# Patient Record
Sex: Female | Born: 1960 | Race: White | Hispanic: No | Marital: Married | Smoking: Former smoker
Health system: Southern US, Community
[De-identification: ages and names within clinical notes are randomized; demographics above are authoritative.]

## PROBLEM LIST (undated history)

## (undated) DIAGNOSIS — F419 Anxiety disorder, unspecified: Secondary | ICD-10-CM

## (undated) DIAGNOSIS — E7404 McArdle disease: Secondary | ICD-10-CM

## (undated) DIAGNOSIS — T7840XA Allergy, unspecified, initial encounter: Secondary | ICD-10-CM

## (undated) HISTORY — PX: COLONOSCOPY: SHX174

## (undated) HISTORY — DX: McArdle disease: E74.04

## (undated) HISTORY — PX: INNER EAR SURGERY: SHX679

## (undated) HISTORY — DX: Allergy, unspecified, initial encounter: T78.40XA

## (undated) HISTORY — PX: UPPER GASTROINTESTINAL ENDOSCOPY: SHX188

## (undated) HISTORY — DX: Anxiety disorder, unspecified: F41.9

---

## 1983-04-27 DIAGNOSIS — E7404 McArdle disease: Secondary | ICD-10-CM

## 1983-04-27 HISTORY — DX: McArdle disease: E74.04

## 1997-09-26 ENCOUNTER — Other Ambulatory Visit: Admission: RE | Admit: 1997-09-26 | Discharge: 1997-09-26 | Payer: Self-pay | Admitting: Obstetrics and Gynecology

## 1998-12-24 ENCOUNTER — Other Ambulatory Visit: Admission: RE | Admit: 1998-12-24 | Discharge: 1998-12-24 | Payer: Self-pay | Admitting: Obstetrics and Gynecology

## 1999-02-11 ENCOUNTER — Encounter: Admission: RE | Admit: 1999-02-11 | Discharge: 1999-02-11 | Payer: Self-pay | Admitting: Obstetrics and Gynecology

## 1999-02-11 ENCOUNTER — Encounter: Payer: Self-pay | Admitting: Obstetrics and Gynecology

## 2000-02-11 ENCOUNTER — Encounter: Payer: Self-pay | Admitting: Gynecology

## 2000-02-11 ENCOUNTER — Encounter: Admission: RE | Admit: 2000-02-11 | Discharge: 2000-02-11 | Payer: Self-pay | Admitting: Gynecology

## 2002-01-15 ENCOUNTER — Other Ambulatory Visit: Admission: RE | Admit: 2002-01-15 | Discharge: 2002-01-15 | Payer: Self-pay | Admitting: Gynecology

## 2002-03-19 ENCOUNTER — Encounter: Admission: RE | Admit: 2002-03-19 | Discharge: 2002-03-19 | Payer: Self-pay | Admitting: Gynecology

## 2002-03-19 ENCOUNTER — Encounter: Payer: Self-pay | Admitting: Gynecology

## 2003-02-05 ENCOUNTER — Other Ambulatory Visit: Admission: RE | Admit: 2003-02-05 | Discharge: 2003-02-05 | Payer: Self-pay | Admitting: Gynecology

## 2003-08-05 ENCOUNTER — Encounter: Admission: RE | Admit: 2003-08-05 | Discharge: 2003-08-05 | Payer: Self-pay | Admitting: Gynecology

## 2004-02-17 ENCOUNTER — Other Ambulatory Visit: Admission: RE | Admit: 2004-02-17 | Discharge: 2004-02-17 | Payer: Self-pay | Admitting: Gynecology

## 2005-03-08 ENCOUNTER — Encounter: Admission: RE | Admit: 2005-03-08 | Discharge: 2005-03-08 | Payer: Self-pay | Admitting: Gynecology

## 2005-03-08 ENCOUNTER — Other Ambulatory Visit: Admission: RE | Admit: 2005-03-08 | Discharge: 2005-03-08 | Payer: Self-pay | Admitting: Gynecology

## 2006-03-14 ENCOUNTER — Other Ambulatory Visit: Admission: RE | Admit: 2006-03-14 | Discharge: 2006-03-14 | Payer: Self-pay | Admitting: Gynecology

## 2006-04-04 ENCOUNTER — Encounter: Admission: RE | Admit: 2006-04-04 | Discharge: 2006-04-04 | Payer: Self-pay | Admitting: Gynecology

## 2007-04-17 ENCOUNTER — Other Ambulatory Visit: Admission: RE | Admit: 2007-04-17 | Discharge: 2007-04-17 | Payer: Self-pay | Admitting: Gynecology

## 2007-04-21 ENCOUNTER — Encounter: Admission: RE | Admit: 2007-04-21 | Discharge: 2007-04-21 | Payer: Self-pay | Admitting: Gynecology

## 2007-05-01 ENCOUNTER — Ambulatory Visit (HOSPITAL_COMMUNITY): Admission: RE | Admit: 2007-05-01 | Discharge: 2007-05-01 | Payer: Self-pay | Admitting: Otolaryngology

## 2008-04-29 ENCOUNTER — Encounter: Admission: RE | Admit: 2008-04-29 | Discharge: 2008-04-29 | Payer: Self-pay | Admitting: Gynecology

## 2009-05-12 ENCOUNTER — Encounter: Admission: RE | Admit: 2009-05-12 | Discharge: 2009-05-12 | Payer: Self-pay | Admitting: Gynecology

## 2010-07-06 ENCOUNTER — Other Ambulatory Visit: Payer: Self-pay | Admitting: Gynecology

## 2010-09-08 NOTE — Op Note (Signed)
[name], [name]             ACCOUNT NO.:  [id number]   MEDICAL RECORD NO.:  [id number]          PATIENT TYPE:  AMB   LOCATION:  SDS                          FACILITY:  [facility]   PHYSICIAN:  Karol T. Lazarus Salines, M.D. DATE OF BIRTH:  26-Jan-1961   DATE OF PROCEDURE:  05/01/2007  DATE OF DISCHARGE:                               OPERATIVE REPORT   PREOPERATIVE DIAGNOSIS:  Left pantympanic perforation, central.   POSTOPERATIVE DIAGNOSIS:  Left pantympanic perforation, central.   PROCEDURE PERFORMED:  A revision left lateral technique tympanoplasty.   SURGEON:  Gloris Manchester. Lazarus Salines, M.D.   ANESTHESIA:  General orotracheal.   BLOOD LOSS:  Minimal.   COMPLICATIONS:  None.   FINDINGS:  A roughly 80% central perforation with an absent long handle  of the malleus consistent with prior surgery.  Mobile ossicular chain.  Healthy middle ear mucosa.  A small mastoid cavity.   PROCEDURE:  With the patient in a comfortable supine position, general  orotracheal anesthesia was induced without difficulty.  At an  appropriate level, the table was turned 90 degrees and the patient  placed in slight reverse Trendelenburg.  The head was rotated to the  right for access to the left ear.  Identifying marks were noted.  The  hair was taped out of the position.  A sterile preparation and draping  of the left ear was accomplished.   1 mL of 1% Xylocaine with 1:100,000 epinephrine was infiltrated into the  vascular strip from posteriorly.  Working down the ear canal, speculum  and cerumen spoon were used to clear where a small amount of wax.  The  external meatus was quite narrow.  Four quadrant injections were  performed with 1% Xylocaine with 1:100,000 epinephrine in the standard  fashion, 2 mL total.  The residual Xylocaine was suctioned free from the  middle ear.   Post auricular incision was reopened and carried down to the bone over  the mastoid and up down to the temporalis muscle.  There was  missing  fascia consistent with two prior tympanoplasties.  Working further  forward, a roughly 1.5 x 1.5 cm piece of fascia was harvested cleaned,  pressed and placed to dry.  Either the superficial temporal artery and  vein or branches thereof were encountered and were finally controlled  with cautery.   The periosteum was dissected at the linea temporalis and then down  across the mastoid cortex and the soft tissue was rolled forward.  A  cavity was encountered and determined to be the mastoid cavity.  Working  from superiorly and inferiorly, the remains of the external canal wall  were identified and dissection was begun down the external canal wall  separating the covering of the mastoid cavity and leaving it in place.   Returning to the ear canal, 6 and 12 o'clock positions were made  beginning at the annulus and carried outward.  These were connected at  the annulus level posteriorly.   Returning to the post auricular area, the ear was laid forward with a  self-retaining retractor.  Dissecting down the ear canal with the  Shambaugh elevator and then with a Rosen round knife, the previously  generated incisions were encountered and the vascular strip flap was  elevated upward and the ear was held forward with a Emergency planning/management officer.  A second self-retaining retractor was placed.  Connecting the 6 and 12  o'clock incisions laterally just at the bony cartilaginous junction, the  flap was window shaded down to the anterior annulus intact.  Upon laying  the flap down against the drum, a 3-mm cutting and 2-mm diamond drill  were used to remove the anterior canal wall bulge to allow better  visualization and access down into the anterior sulcus.  The area was  thoroughly irrigated and cleansed.   Using a small Rosen round knife and the tab knife, the skin was laid  down the level of the annulus and then working circumferentially along  the annulus, was dissected off the annulus and  brought inferiorly,  superiorly to the short process of the malleus and then posteriorly.  The skin was elevated from the fascial layer of the drum which was  minimal in remnant.  The skin was preserved for later use.  A small chip  probably of malleus was dissected and removed.  The stump of the long  process of the malleus was identified and the posterior annulus was  elevated and with mobilization of the malleus, the long process of the  incus and stapes superstructure were observed to move.  No further  attention to the ossicular chain was necessary.  The annulus was laid  back down posteriorly.   At this point, the temporalis fascia was cut into a tongue shape and  then placed down.  It tucked up against the anterior canal wall laying  on top of the annulus with an excellent fit and did not overlap.  It was  laid up the posterior canal wall.  The ear canal skin flap which had  been carefully oriented to be squamous side up was laid against the drum  and anterior canal wall and tucked down into the anterior sulcus.  Thin  strips of dried Gelfoam were tucked into the anterior sulcus to hold  this down to prevent blunting.  After applying several small strips,  larger pieces of Ciprodex moistened Gelfoam were placed to support the  graft.   At this point, self-retaining retractors and Perkins retractors were  removed.  The ear was laid back down.  The skin flap was laid forward  posteriorly and then working down the canal with the speculum, the flap  was laid on top of the temporalis fascia graft and the canal was further  supported with loosely placed Ciprodex moistened Gelfoam.  Hemostasis  was observed.  Postauricular wound was closed with interrupted 4-0  chromic sutures.  Benzoin and Steri-Strips were used to finish the post  auricular closure.  A cotton ball was wedged in the external meatus  deeply.  Another cotton ball was laid into the conchal bowl.  This  completed the  procedure.  A standard Glasscock dressing was applied  without difficulty.  At this point the procedure was completed.  The  patient was returned to Anesthesia, awakened, extubated, and transferred  to recovery in stable condition.   COMMENT:  A 50 year old white female status post two prior attempts at  tympanoplasty, one in childhood and this procedure apparently included a  mastoidectomy and then a second procedure by Dr. Nedra Hai using medial  technique some years back.  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  Anticipate routine postoperative recovery with attention to  ice, elevation, antibiosis, analgesia, avoidance of water exposure and  nose blowing.  I will see her back in one week to remove the cotton ball  and begin to dissolve the Gelfoam.  Given low anticipated risk of  postanesthetic or postsurgical complications, I feel outpatient venue is  appropriate.      Gloris Manchester. Lazarus Salines, M.D.  Electronically Signed     KTW/MEDQ  D:  05/01/2007  T:  05/01/2007  Job:  [id number]

## 2011-01-14 LAB — COMPREHENSIVE METABOLIC PANEL WITH GFR
ALT: 52 — ABNORMAL HIGH
AST: 61 — ABNORMAL HIGH
Albumin: 3.5
Alkaline Phosphatase: 59
BUN: 6
CO2: 29
Calcium: 8.6
Chloride: 106
Creatinine, Ser: 0.76
GFR calc non Af Amer: >60
Glucose, Bld: 90
Potassium: 3.5
Sodium: 139
Total Bilirubin: 0.6
Total Protein: 5.9 — ABNORMAL LOW

## 2011-01-14 LAB — CBC
HCT: 40.8
Hemoglobin: 14
MCHC: 34.2
MCV: 91.8
Platelets: 229
RBC: 4.45
RDW: 12.6
WBC: 5.5

## 2011-01-14 LAB — URINALYSIS, MICROSCOPIC ONLY
Glucose, UA: NEGATIVE
Hgb urine dipstick: NEGATIVE
Ketones, ur: NEGATIVE
Protein, ur: NEGATIVE

## 2011-05-11 ENCOUNTER — Other Ambulatory Visit: Payer: Self-pay | Admitting: Psychiatry

## 2011-06-01 ENCOUNTER — Other Ambulatory Visit: Payer: Self-pay | Admitting: Gynecology

## 2011-06-01 DIAGNOSIS — Z1231 Encounter for screening mammogram for malignant neoplasm of breast: Secondary | ICD-10-CM

## 2011-06-21 ENCOUNTER — Ambulatory Visit
Admission: RE | Admit: 2011-06-21 | Discharge: 2011-06-21 | Disposition: A | Discharge status: Home / Self Care | Payer: BC Managed Care – PPO | Source: Ambulatory Visit | Attending: Gynecology | Admitting: Gynecology

## 2011-06-21 DIAGNOSIS — Z1231 Encounter for screening mammogram for malignant neoplasm of breast: Secondary | ICD-10-CM

## 2011-07-12 ENCOUNTER — Other Ambulatory Visit: Payer: Self-pay | Admitting: Gynecology

## 2012-06-20 ENCOUNTER — Other Ambulatory Visit: Payer: Self-pay | Admitting: Gynecology

## 2012-06-20 DIAGNOSIS — Z1231 Encounter for screening mammogram for malignant neoplasm of breast: Secondary | ICD-10-CM

## 2012-06-26 ENCOUNTER — Ambulatory Visit: Payer: BC Managed Care – PPO

## 2012-07-03 ENCOUNTER — Ambulatory Visit
Admission: RE | Admit: 2012-07-03 | Discharge: 2012-07-03 | Disposition: A | Discharge status: Home / Self Care | Payer: BC Managed Care – PPO | Source: Ambulatory Visit | Attending: Gynecology | Admitting: Gynecology

## 2012-07-03 DIAGNOSIS — Z1231 Encounter for screening mammogram for malignant neoplasm of breast: Secondary | ICD-10-CM

## 2012-11-09 ENCOUNTER — Ambulatory Visit (INDEPENDENT_AMBULATORY_CARE_PROVIDER_SITE_OTHER): Payer: BC Managed Care – PPO | Admitting: General Practice

## 2012-11-09 ENCOUNTER — Encounter: Payer: Self-pay | Admitting: General Practice

## 2012-11-09 VITALS — BP 107/71 | HR 74 | Temp 97.0°F | Ht 63.0 in | Wt 139.0 lb

## 2012-11-09 DIAGNOSIS — R300 Dysuria: Secondary | ICD-10-CM

## 2012-11-09 DIAGNOSIS — Z0000 Encounter for general adult medical examination without abnormal findings: Secondary | ICD-10-CM

## 2012-11-09 DIAGNOSIS — R5381 Other malaise: Secondary | ICD-10-CM

## 2012-11-09 DIAGNOSIS — Z833 Family history of diabetes mellitus: Secondary | ICD-10-CM

## 2012-11-09 DIAGNOSIS — R5383 Other fatigue: Secondary | ICD-10-CM

## 2012-11-09 LAB — COMPLETE METABOLIC PANEL WITHOUT GFR
ALT: 40 U/L — ABNORMAL HIGH (ref 0–35)
AST: 47 U/L — ABNORMAL HIGH (ref 0–37)
Albumin: 4.2 g/dL (ref 3.5–5.2)
Alkaline Phosphatase: 70 U/L (ref 39–117)
BUN: 18 mg/dL (ref 6–23)
CO2: 30 meq/L (ref 19–32)
Calcium: 9.5 mg/dL (ref 8.4–10.5)
Chloride: 104 meq/L (ref 96–112)
Creat: 0.77 mg/dL (ref 0.50–1.10)
GFR, Est African American: >89 mL/min
GFR, Est Non African American: 88 mL/min
Glucose, Bld: 85 mg/dL (ref 70–99)
Potassium: 4.4 meq/L (ref 3.5–5.3)
Sodium: 142 meq/L (ref 135–145)
Total Bilirubin: 0.6 mg/dL (ref 0.3–1.2)
Total Protein: 6.5 g/dL (ref 6.0–8.3)

## 2012-11-09 LAB — POCT CBC
Granulocyte percent: 65.7 %G (ref 37–80)
HCT, POC: 40.8 % (ref 37.7–47.9)
Hemoglobin: 14.8 g/dL (ref 12.2–16.2)
MCH, POC: 33.2 pg — AB (ref 27–31.2)
MCHC: 36.3 g/dL — AB (ref 31.8–35.4)
MCV: 91.3 fL (ref 80–97)
MPV: 7.4 fL (ref 0–99.8)
POC Granulocyte: 3.2 (ref 2–6.9)
Platelet Count, POC: 195 10*3/uL (ref 142–424)
RBC: 4.4 M/uL (ref 4.04–5.48)
RDW, POC: 12.2 %

## 2012-11-09 LAB — POCT UA - MICROSCOPIC ONLY
Bacteria, U Microscopic: NEGATIVE
Casts, Ur, LPF, POC: NEGATIVE
Yeast, UA: NEGATIVE

## 2012-11-09 LAB — POCT URINALYSIS DIPSTICK
Glucose, UA: NEGATIVE
Ketones, UA: NEGATIVE
Protein, UA: NEGATIVE
Spec Grav, UA: 1.02
pH, UA: 6

## 2012-11-09 LAB — THYROID PANEL WITH TSH
Free Thyroxine Index: 2.5 (ref 1.0–3.9)
T3 Uptake: 31.6 % (ref 22.5–37.0)
T4, Total: 7.8 ug/dL (ref 5.0–12.5)
TSH: 1.501 u[IU]/mL (ref 0.350–4.500)

## 2012-11-09 LAB — VITAMIN B12: Vitamin B-12: 558 pg/mL (ref 211–911)

## 2012-11-09 NOTE — Progress Notes (Signed)
  Subjective:    Patient ID: [name], female    DOB: 04-22-1961, 52 y.o.   MRN: [id number]  HPI Patient presents today for annual physical, without pap. She reports having a gyn. She reports being more tired than normal. She also reports dark colored urine and burning with urination times one week. She denies vaginal discharge. She reports taking wellbutrin, lexapro, and xanax, which are prescribed by another provider. She reports medications are effective and denies any other complaints.    Review of Systems  Constitutional: Negative for fever and chills.  HENT: Negative for neck pain and neck stiffness.   Respiratory: Negative for chest tightness and shortness of breath.   Cardiovascular: Negative for chest pain and palpitations.  Gastrointestinal: Negative for nausea, vomiting, abdominal pain and blood in stool.  Genitourinary: Positive for dysuria and frequency. Negative for urgency, vaginal bleeding, vaginal discharge and difficulty urinating.  Skin: Negative for rash.  Neurological: Negative for dizziness, weakness and headaches.       Objective:   Physical Exam  Constitutional: She is oriented to person, place, and time. She appears well-developed and well-nourished.  HENT:  Head: Normocephalic and atraumatic.  Right Ear: External ear normal.  Left Ear: External ear normal.  Mouth/Throat: Oropharynx is clear and moist.  Cardiovascular: Normal rate, regular rhythm and normal heart sounds.   Pulmonary/Chest: Effort normal and breath sounds normal.  Abdominal: Soft. Bowel sounds are normal. She exhibits no distension. There is no tenderness.  Neurological: She is alert and oriented to person, place, and time.  Skin: Skin is warm and dry.  Psychiatric: She has a normal mood and affect.          Assessment & Plan:  1. Dysuria - POCT urinalysis dipstick - POCT UA - Microscopic Only  2. Family history of diabetes mellitus - POCT glycosylated hemoglobin (Hb  A1C)  3. Annual physical exam - POCT CBC - COMPLETE METABOLIC PANEL WITH GFR  4. Tiredness - Thyroid Panel With TSH - Vitamin B12 - Vitamin D 25 hydroxy -Continue all current medications Labs pending F/u in 1 year unless symptoms develop Discussed exercise and diet  -Patient verbalized understanding -Coralie Keens, FNP-C

## 2012-11-09 NOTE — Patient Instructions (Addendum)
Fall Prevention and Home Safety °Falls cause injuries and can affect all age groups. It is possible to prevent falls.  °HOW TO PREVENT FALLS °· Wear shoes with rubber soles that do not have an opening for your toes. °· Keep the inside and outside of your house well lit. °· Use night lights throughout your home. °· Remove clutter from floors. °· Clean up floor spills. °· Remove throw rugs or fasten them to the floor with carpet tape. °· Do not place electrical cords across pathways. °· Put grab bars by your tub, shower, and toilet. Do not use towel bars as grab bars. °· Put handrails on both sides of the stairway. Fix loose handrails. °· Do not climb on stools or stepladders, if possible. °· Do not wax your floors. °· Repair uneven or unsafe sidewalks, walkways, or stairs. °· Keep items you use a lot within reach. °· Be aware of pets. °· Keep emergency numbers next to the telephone. °· Put smoke detectors in your home and near bedrooms. °Ask your doctor what other things you can do to prevent falls. °Document Released: 02/06/2009 Document Revised: 10/12/2011 Document Reviewed: 07/13/2011 °ExitCare® Patient Information ©2014 [employer]. ° °

## 2012-11-13 ENCOUNTER — Telehealth: Payer: Self-pay | Admitting: General Practice

## 2012-11-13 NOTE — Telephone Encounter (Signed)
Pt will be given results tomorrow after reviewed by provider.

## 2012-11-14 ENCOUNTER — Telehealth: Payer: Self-pay | Admitting: General Practice

## 2012-11-14 NOTE — Telephone Encounter (Signed)
Details left on vm for pt. 

## 2012-12-05 ENCOUNTER — Other Ambulatory Visit: Payer: Self-pay | Admitting: Gynecology

## 2012-12-05 DIAGNOSIS — M949 Disorder of cartilage, unspecified: Secondary | ICD-10-CM

## 2013-07-18 ENCOUNTER — Encounter (INDEPENDENT_AMBULATORY_CARE_PROVIDER_SITE_OTHER): Payer: BC Managed Care – PPO

## 2013-07-18 ENCOUNTER — Encounter: Payer: Self-pay | Admitting: General Practice

## 2013-07-18 ENCOUNTER — Ambulatory Visit (INDEPENDENT_AMBULATORY_CARE_PROVIDER_SITE_OTHER): Payer: BC Managed Care – PPO | Admitting: General Practice

## 2013-07-18 VITALS — BP 109/63 | HR 94 | Temp 98.4°F | Ht 62.75 in | Wt 150.6 lb

## 2013-07-18 DIAGNOSIS — B86 Scabies: Secondary | ICD-10-CM

## 2013-07-18 DIAGNOSIS — Z2089 Contact with and (suspected) exposure to other communicable diseases: Secondary | ICD-10-CM

## 2013-07-18 DIAGNOSIS — Z207 Contact with and (suspected) exposure to pediculosis, acariasis and other infestations: Secondary | ICD-10-CM

## 2013-07-18 MED ORDER — PERMETHRIN 5 % EX CREA: 1.0000 "application " | TOPICAL_CREAM | Freq: Once | CUTANEOUS | Status: DC

## 2013-07-18 NOTE — Patient Instructions (Signed)
Scabies  Scabies are small bugs (mites) that burrow under the skin and cause red bumps and severe itching. These bugs can only be seen with a microscope. Scabies are highly contagious. They can spread easily from person to person by direct contact. They are also spread through sharing clothing or linens that have the scabies mites living in them. It is not unusual for an entire family to become infected through shared towels, clothing, or bedding.   HOME CARE INSTRUCTIONS   · Your caregiver may prescribe a cream or lotion to kill the mites. If cream is prescribed, massage the cream into the entire body from the neck to the bottom of both feet. Also massage the cream into the scalp and face if your child is less than 1 year old. Avoid the eyes and mouth. Do not wash your hands after application.  · Leave the cream on for 8 to 12 hours. Your child should bathe or shower after the 8 to 12 hour application period. Sometimes it is helpful to apply the cream to your child right before bedtime.  · One treatment is usually effective and will eliminate approximately 95% of infestations. For severe cases, your caregiver may decide to repeat the treatment in 1 week. Everyone in your household should be treated with one application of the cream.  · New rashes or burrows should not appear within 24 to 48 hours after successful treatment. However, the itching and rash may last for 2 to 4 weeks after successful treatment. Your caregiver may prescribe a medicine to help with the itching or to help the rash go away more quickly.  · Scabies can live on clothing or linens for up to 3 days. All of your child's recently used clothing, towels, stuffed toys, and bed linens should be washed in hot water and then dried in a dryer for at least 20 minutes on high heat. Items that cannot be washed should be enclosed in a plastic bag for at least 3 days.  · To help relieve itching, bathe your child in a cool bath or apply cool washcloths to the  affected areas.  · Your child may return to school after treatment with the prescribed cream.  SEEK MEDICAL CARE IF:   · The itching persists longer than 4 weeks after treatment.  · The rash spreads or becomes infected. Signs of infection include red blisters or yellow-tan crust.  Document Released: 04/12/2005 Document Revised: 07/05/2011 Document Reviewed: 08/21/2008  ExitCare® Patient Information ©2014 [employer].

## 2013-07-18 NOTE — Progress Notes (Signed)
Subjective:    Patient ID: [name], female    DOB: [birth year], 53 y.o.   MRN: [id number]  HPI Patient presents today with complaints of itching that is worse at night. She has been exposed to scabies, two days ago. Reports having a red itchy rash on arms/hands.     Review of Systems  Constitutional: Negative for fever and chills.  Respiratory: Negative for chest tightness and shortness of breath.   Cardiovascular: Negative for chest pain and palpitations.  Skin:       Red itchy rash        Objective:   Physical Exam  Constitutional: She is oriented to person, place, and time. She appears well-developed and well-nourished.  Cardiovascular: Normal rate, regular rhythm and normal heart sounds.   Pulmonary/Chest: Effort normal and breath sounds normal. No respiratory distress. She exhibits no tenderness.  Neurological: She is alert and oriented to person, place, and time.  Skin: Skin is warm and dry. There is erythema.  Erythematous, rash noted to bilateral lower arms, with burrowed marks.   Psychiatric: She has a normal mood and affect.          Assessment & Plan:  1. Scabies exposure - permethrin (ELIMITE) 5 % cream; Apply 1 application topically once.  Dispense: 60 g; Refill: 0 -discussed and provided information on scabies and application of cream -RTO if symptoms worsen or unresolved -Patient verbalized understanding Coralie Keens, FNP-C

## 2013-08-30 ENCOUNTER — Other Ambulatory Visit: Payer: Self-pay

## 2013-08-30 DIAGNOSIS — Z1231 Encounter for screening mammogram for malignant neoplasm of breast: Secondary | ICD-10-CM

## 2013-09-24 ENCOUNTER — Ambulatory Visit
Admission: RE | Admit: 2013-09-24 | Discharge: 2013-09-24 | Disposition: A | Discharge status: Home / Self Care | Payer: BC Managed Care – PPO | Source: Ambulatory Visit

## 2013-09-24 ENCOUNTER — Ambulatory Visit
Admission: RE | Admit: 2013-09-24 | Discharge: 2013-09-24 | Disposition: A | Discharge status: Home / Self Care | Payer: BC Managed Care – PPO | Source: Ambulatory Visit | Attending: Gynecology | Admitting: Gynecology

## 2013-09-24 ENCOUNTER — Encounter (INDEPENDENT_AMBULATORY_CARE_PROVIDER_SITE_OTHER): Payer: Self-pay

## 2013-09-24 DIAGNOSIS — M899 Disorder of bone, unspecified: Secondary | ICD-10-CM

## 2013-09-24 DIAGNOSIS — M949 Disorder of cartilage, unspecified: Principal | ICD-10-CM

## 2013-09-24 DIAGNOSIS — Z1231 Encounter for screening mammogram for malignant neoplasm of breast: Secondary | ICD-10-CM

## 2013-09-25 ENCOUNTER — Other Ambulatory Visit: Payer: Self-pay | Admitting: Gynecology

## 2013-09-25 DIAGNOSIS — R928 Other abnormal and inconclusive findings on diagnostic imaging of breast: Secondary | ICD-10-CM

## 2013-10-15 ENCOUNTER — Ambulatory Visit
Admission: RE | Admit: 2013-10-15 | Discharge: 2013-10-15 | Disposition: A | Discharge status: Home / Self Care | Payer: BC Managed Care – PPO | Source: Ambulatory Visit | Attending: Gynecology | Admitting: Gynecology

## 2013-10-15 DIAGNOSIS — R928 Other abnormal and inconclusive findings on diagnostic imaging of breast: Secondary | ICD-10-CM

## 2013-10-22 ENCOUNTER — Ambulatory Visit (INDEPENDENT_AMBULATORY_CARE_PROVIDER_SITE_OTHER): Payer: BC Managed Care – PPO | Admitting: Family

## 2013-10-22 VITALS — BP 110/70 | HR 82 | Temp 97.6°F | Ht 63.0 in | Wt 150.2 lb

## 2013-10-22 DIAGNOSIS — N390 Urinary tract infection, site not specified: Secondary | ICD-10-CM

## 2013-10-22 DIAGNOSIS — R350 Frequency of micturition: Secondary | ICD-10-CM

## 2013-10-22 LAB — POCT URINALYSIS DIPSTICK
Bilirubin, UA: NEGATIVE
Glucose, UA: NEGATIVE
KETONES UA: NEGATIVE
NITRITE UA: NEGATIVE
SPEC GRAV UA: 1.025
Urobilinogen, UA: NEGATIVE
pH, UA: 5

## 2013-10-22 LAB — POCT UA - MICROSCOPIC ONLY
CASTS, UR, LPF, POC: NEGATIVE
CRYSTALS, UR, HPF, POC: NEGATIVE
Yeast, UA: NEGATIVE

## 2013-10-22 MED ORDER — NITROFURANTOIN MONOHYD MACRO 100 MG PO CAPS: 100.0000 mg | ORAL_CAPSULE | Freq: Two times a day (BID) | ORAL | Status: DC

## 2013-10-22 NOTE — Progress Notes (Signed)
   Subjective:    Patient ID: [name], female    DOB: [birth year], 53 y.o.   MRN: [id number]  Urinary Tract Infection  This is a new problem. The current episode started 1 to 4 weeks ago (about a week and half ago). The problem occurs intermittently. The problem has been waxing and waning. The quality of the pain is described as burning. The pain is at a severity of 5/10. The pain is moderate. There has been no fever. There is no history of pyelonephritis. Associated symptoms include urgency. Pertinent negatives include no chills, discharge, frequency, hematuria, nausea or vomiting. Treatments tried: AZO. The treatment provided mild relief.      Review of Systems  Constitutional: Negative for chills.  Respiratory: Negative.   Cardiovascular: Negative.   Gastrointestinal: Negative.  Negative for nausea and vomiting.  Genitourinary: Positive for urgency. Negative for frequency and hematuria.  Musculoskeletal: Negative.   All other systems reviewed and are negative.      Objective:   Physical Exam  Vitals reviewed. Constitutional: She is oriented to person, place, and time. She appears well-developed and well-nourished. No distress.  Cardiovascular: Normal rate, regular rhythm, normal heart sounds and intact distal pulses.   No murmur heard. Pulmonary/Chest: Effort normal and breath sounds normal. No respiratory distress. She has no wheezes.  Abdominal: Soft. Bowel sounds are normal. She exhibits no distension. There is no tenderness.  Musculoskeletal: Normal range of motion. She exhibits no edema and no tenderness.  Negative for CVA tenderness   Neurological: She is alert and oriented to person, place, and time.  Skin: Skin is warm and dry.  Psychiatric: She has a normal mood and affect. Her behavior is normal. Judgment and thought content normal.      BP 110/70  Pulse 82  Temp(Src) 97.6 F (36.4 C) (Oral)  Ht 5\' 3"  (1.6 m)  Wt 150 lb 3.2 oz (68.13 kg)  BMI 26.61  kg/m2    Results for orders placed in visit on 10/22/13  POCT UA - MICROSCOPIC ONLY      Result Value Ref Range   WBC, Ur, HPF, POC 10-15     RBC, urine, microscopic 1-5     Bacteria, U Microscopic mod     Mucus, UA trace     Epithelial cells, urine per micros mod     Crystals, Ur, HPF, POC neg     Casts, Ur, LPF, POC neg     Yeast, UA neg    POCT URINALYSIS DIPSTICK      Result Value Ref Range   Color, UA gold     Clarity, UA clear     Glucose, UA neg     Bilirubin, UA neg     Ketones, UA neg     Spec Grav, UA 1.025     Blood, UA large     pH, UA 5.0     Protein, UA 4+     Urobilinogen, UA negative     Nitrite, UA neg     Leukocytes, UA large (3+)      Assessment & Plan:  1. Frequent urination - POCT UA - Microscopic Only - POCT urinalysis dipstick  2. Urinary tract infection without hematuria, site unspecified -Force fluids AZO over the counter X2 days RTO prn - nitrofurantoin, macrocrystal-monohydrate, (MACROBID) 100 MG capsule; Take 1 capsule (100 mg total) by mouth 2 (two) times daily.  Dispense: 10 capsule; Refill: 0  Jannifer Rodneyhristy Hawks, FNP

## 2013-10-22 NOTE — Patient Instructions (Signed)

## 2013-10-31 ENCOUNTER — Ambulatory Visit (INDEPENDENT_AMBULATORY_CARE_PROVIDER_SITE_OTHER): Payer: BC Managed Care – PPO | Admitting: Family Medicine

## 2013-10-31 ENCOUNTER — Encounter: Payer: Self-pay | Admitting: Family Medicine

## 2013-10-31 VITALS — BP 97/63 | HR 76 | Temp 97.7°F | Ht 63.0 in | Wt 151.2 lb

## 2013-10-31 DIAGNOSIS — B373 Candidiasis of vulva and vagina: Secondary | ICD-10-CM

## 2013-10-31 DIAGNOSIS — R350 Frequency of micturition: Secondary | ICD-10-CM

## 2013-10-31 DIAGNOSIS — B3731 Acute candidiasis of vulva and vagina: Secondary | ICD-10-CM

## 2013-10-31 LAB — POCT UA - MICROSCOPIC ONLY
Casts, Ur, LPF, POC: NEGATIVE
Crystals, Ur, HPF, POC: NEGATIVE
Mucus, UA: NEGATIVE
Yeast, UA: NEGATIVE

## 2013-10-31 LAB — POCT URINALYSIS DIPSTICK
Bilirubin, UA: NEGATIVE
Glucose, UA: NEGATIVE
Ketones, UA: NEGATIVE
Leukocytes, UA: NEGATIVE
Nitrite, UA: NEGATIVE
Spec Grav, UA: >=1.03
Urobilinogen, UA: NEGATIVE
pH, UA: 5

## 2013-10-31 MED ORDER — FLUCONAZOLE 150 MG PO TABS: 150.0000 mg | ORAL_TABLET | Freq: Once | ORAL | Status: DC

## 2013-10-31 NOTE — Progress Notes (Signed)
   Subjective:    Patient ID: [name], female    DOB: 1960-10-19, 53 y.o.   MRN: [id number]  HPI  C/o vaginal pruritis and was tx with abx's for uti and was wanting to make sure abx's worked  Review of Systems    No chest pain, SOB, HA, dizziness, vision change, N/V, diarrhea, constipation, dysuria, urinary urgency or frequency, myalgias, arthralgias or rash.  Objective:   Physical Exam  Vital signs noted  Well developed well nourished female.  HEENT - Head atraumatic Normocephalic                Eyes - PERRLA, Conjuctiva - clear Sclera- Clear EOMI                Ears - EAC's Wnl TM's Wnl Gross Hearing WNL                Nose - Nares patent                 Throat - oropharanx wnl Respiratory - Lungs CTA bilateral Cardiac - RRR S1 and S2 without murmur GI - Abdomen soft Nontender and bowel sounds active x 4 Extremities - No edema. Neuro - Grossly intact.  Results for orders placed in visit on 10/31/13  POCT UA - MICROSCOPIC ONLY      Result Value Ref Range   WBC, Ur, HPF, POC 1-3     RBC, urine, microscopic 1-3     Bacteria, U Microscopic moderate     Mucus, UA negative     Epithelial cells, urine per micros occ     Crystals, Ur, HPF, POC negative     Casts, Ur, LPF, POC negative     Yeast, UA negative    POCT URINALYSIS DIPSTICK      Result Value Ref Range   Color, UA gold     Clarity, UA clear     Glucose, UA neg     Bilirubin, UA neg     Ketones, UA neg     Spec Grav, UA >=1.030     Blood, UA mod     pH, UA 5.0     Protein, UA trace     Urobilinogen, UA negative     Nitrite, UA neg     Leukocytes, UA Negative        Assessment & Plan:  Frequent urination - Plan: POCT UA - Microscopic Only, POCT urinalysis dipstick UTI resolved  Candidiasis of female genitalia - Plan: fluconazole (DIFLUCAN) 150 MG tablet  Deatra CanterWilliam J Oxford FNP

## 2013-11-26 ENCOUNTER — Ambulatory Visit (INDEPENDENT_AMBULATORY_CARE_PROVIDER_SITE_OTHER): Payer: BC Managed Care – PPO | Admitting: Family

## 2013-11-26 ENCOUNTER — Encounter: Payer: Self-pay | Admitting: Family

## 2013-11-26 VITALS — BP 108/64 | HR 85 | Temp 98.9°F | Ht 63.0 in | Wt 151.2 lb

## 2013-11-26 DIAGNOSIS — F329 Major depressive disorder, single episode, unspecified: Secondary | ICD-10-CM

## 2013-11-26 DIAGNOSIS — R5381 Other malaise: Secondary | ICD-10-CM

## 2013-11-26 DIAGNOSIS — F3289 Other specified depressive episodes: Secondary | ICD-10-CM

## 2013-11-26 DIAGNOSIS — Z0000 Encounter for general adult medical examination without abnormal findings: Secondary | ICD-10-CM

## 2013-11-26 DIAGNOSIS — F32A Depression, unspecified: Secondary | ICD-10-CM | POA: Insufficient documentation

## 2013-11-26 DIAGNOSIS — F411 Generalized anxiety disorder: Secondary | ICD-10-CM | POA: Insufficient documentation

## 2013-11-26 DIAGNOSIS — Z23 Encounter for immunization: Secondary | ICD-10-CM

## 2013-11-26 DIAGNOSIS — R5383 Other fatigue: Secondary | ICD-10-CM

## 2013-11-26 LAB — POCT UA - MICROSCOPIC ONLY
CRYSTALS, UR, HPF, POC: NEGATIVE
Casts, Ur, LPF, POC: NEGATIVE
MUCUS UA: NEGATIVE
Yeast, UA: NEGATIVE

## 2013-11-26 LAB — POCT URINALYSIS DIPSTICK
Bilirubin, UA: NEGATIVE
GLUCOSE UA: NEGATIVE
KETONES UA: NEGATIVE
Leukocytes, UA: NEGATIVE
Nitrite, UA: NEGATIVE
PROTEIN UA: NEGATIVE
SPEC GRAV UA: 1.015
Urobilinogen, UA: NEGATIVE
pH, UA: 6

## 2013-11-26 NOTE — Addendum Note (Signed)
Addended by: Fawn KirkHOLT, CATHY on: 11/26/2013 10:52 AM   Modules accepted: Orders

## 2013-11-26 NOTE — Progress Notes (Signed)
   Subjective:    Patient ID: [name], female    DOB: 09/23/1960, 53 y.o.   MRN: [id number]  Pt presents to office of annual physical without pap. Pt currently taking medications for depression and anxiety. Pt denies SOB, palpations, or edema.   Anxiety Presents for follow-up visit. Symptoms include depressed mood. Patient reports no chest pain, excessive worry, insomnia, irritability, nervous/anxious behavior, palpitations, panic, restlessness or shortness of breath. Symptoms occur rarely. The severity of symptoms is mild. The symptoms are aggravated by family issues.   Her past medical history is significant for anxiety/panic attacks and depression. Past treatments include SSRIs and benzodiazephines. Compliance with prior treatments has been good.      Review of Systems  Constitutional: Negative.  Negative for irritability.  HENT: Negative.   Eyes: Negative.   Respiratory: Negative.  Negative for shortness of breath.   Cardiovascular: Negative.  Negative for chest pain and palpitations.  Gastrointestinal: Negative.   Endocrine: Negative.   Genitourinary: Negative.   Musculoskeletal: Negative.   Neurological: Negative.  Negative for headaches.  Hematological: Negative.   Psychiatric/Behavioral: Negative.  The patient is not nervous/anxious and does not have insomnia.   All other systems reviewed and are negative.      Objective:   Physical Exam  Vitals reviewed. Constitutional: She is oriented to person, place, and time. She appears well-developed and well-nourished. No distress.  HENT:  Head: Normocephalic and atraumatic.  Right Ear: External ear normal.  Left Ear: External ear normal.  Nose: Nose normal.  Mouth/Throat: Oropharynx is clear and moist.  Eyes: Pupils are equal, round, and reactive to light.  Neck: Normal range of motion. Neck supple. No thyromegaly present.  Cardiovascular: Normal rate, regular rhythm, normal heart sounds and intact distal pulses.     No murmur heard. Pulmonary/Chest: Effort normal and breath sounds normal. No respiratory distress. She has no wheezes.  Abdominal: Soft. Bowel sounds are normal. She exhibits no distension. There is no tenderness.  Musculoskeletal: Normal range of motion. She exhibits no edema and no tenderness.  Neurological: She is alert and oriented to person, place, and time. She has normal reflexes. No cranial nerve deficit.  Skin: Skin is warm and dry.  Psychiatric: She has a normal mood and affect. Her behavior is normal. Judgment and thought content normal.   BP 108/64  Pulse 85  Temp(Src) 98.9 F (37.2 C) (Oral)  Ht $R'5\' 3"'KA$  (1.6 m)  Wt 151 lb 3.2 oz (68.584 kg)  BMI 26.79 kg/m2       Assessment & Plan:  1. Routine general medical examination at a health care facility - POCT UA - Microscopic Only - POCT urinalysis dipstick - CMP14+EGFR  2. Depression - CMP14+EGFR  3. GAD (generalized anxiety disorder) - CMP14+EGFR  4. Annual physical exam  - CMP14+EGFR - Lipid panel - Vit D  25 hydroxy (rtn osteoporosis monitoring) - Thyroid Panel With TSH  5. Other malaise and fatigue - CMP14+EGFR - Vit D  25 hydroxy (rtn osteoporosis monitoring) - Thyroid Panel With TSH   Continue all meds Labs pending Health Maintenance reviewed-TDAP given today Diet and exercise encouraged RTO 1 year  Evelina Dun, FNP

## 2013-11-26 NOTE — Patient Instructions (Signed)

## 2013-11-27 ENCOUNTER — Telehealth: Payer: Self-pay | Admitting: Family Medicine

## 2013-11-27 ENCOUNTER — Other Ambulatory Visit: Payer: Self-pay | Admitting: Family

## 2013-11-27 LAB — CMP14+EGFR
A/G RATIO: 1.9 (ref 1.1–2.5)
ALBUMIN: 4.2 g/dL (ref 3.5–5.5)
ALT: 34 IU/L — ABNORMAL HIGH (ref 0–32)
AST: 33 IU/L (ref 0–40)
Alkaline Phosphatase: 74 IU/L (ref 39–117)
BILIRUBIN TOTAL: 0.4 mg/dL (ref 0.0–1.2)
BUN/Creatinine Ratio: 18 (ref 9–23)
BUN: 14 mg/dL (ref 6–24)
CO2: 25 mmol/L (ref 18–29)
Calcium: 9.2 mg/dL (ref 8.7–10.2)
Chloride: 104 mmol/L (ref 97–108)
Creatinine, Ser: 0.78 mg/dL (ref 0.57–1.00)
GFR, EST AFRICAN AMERICAN: 100 mL/min/{1.73_m2} (ref >59)
GFR, EST NON AFRICAN AMERICAN: 86 mL/min/{1.73_m2} (ref >59)
Globulin, Total: 2.2 g/dL (ref 1.5–4.5)
Glucose: 84 mg/dL (ref 65–99)
Potassium: 5.2 mmol/L (ref 3.5–5.2)
Sodium: 143 mmol/L (ref 134–144)
Total Protein: 6.4 g/dL (ref 6.0–8.5)

## 2013-11-27 LAB — LIPID PANEL
CHOL/HDL RATIO: 2.5 ratio (ref 0.0–4.4)
Cholesterol, Total: 168 mg/dL (ref 100–199)
HDL: 67 mg/dL (ref >39)
LDL CALC: 80 mg/dL (ref 0–99)
Triglycerides: 106 mg/dL (ref 0–149)
VLDL CHOLESTEROL CAL: 21 mg/dL (ref 5–40)

## 2013-11-27 LAB — THYROID PANEL WITH TSH
Free Thyroxine Index: 1.7 (ref 1.2–4.9)
T3 Uptake Ratio: 25 % (ref 24–39)
T4, Total: 6.7 ug/dL (ref 4.5–12.0)
TSH: 1.1 u[IU]/mL (ref 0.450–4.500)

## 2013-11-27 LAB — VITAMIN D 25 HYDROXY (VIT D DEFICIENCY, FRACTURES): VIT D 25 HYDROXY: 45.6 ng/mL (ref 30.0–100.0)

## 2013-11-27 MED ORDER — NITROFURANTOIN MONOHYD MACRO 100 MG PO CAPS: 100.0000 mg | ORAL_CAPSULE | Freq: Two times a day (BID) | ORAL | Status: DC

## 2013-11-27 NOTE — Telephone Encounter (Signed)
Message copied by Azalee CourseFULP, Sarika Baldini on Tue Nov 27, 2013  3:32 PM ------      Message from: LongoriaHAWKS, [place] A      Created: Tue Nov 27, 2013 11:04 AM       Urine- Positive for UTI- RX sent to pharmacy      Kidney and liver function stable      Cholesterol panel-WNL      Thyroid levels WNL      Vit D levels WNL ------

## 2013-12-17 ENCOUNTER — Encounter: Payer: Self-pay | Admitting: Family Medicine

## 2013-12-17 ENCOUNTER — Ambulatory Visit (INDEPENDENT_AMBULATORY_CARE_PROVIDER_SITE_OTHER): Payer: BC Managed Care – PPO | Admitting: Family Medicine

## 2013-12-17 VITALS — BP 107/70 | HR 81 | Temp 97.7°F | Ht 63.0 in | Wt 149.6 lb

## 2013-12-17 DIAGNOSIS — M25531 Pain in right wrist: Secondary | ICD-10-CM

## 2013-12-17 DIAGNOSIS — M25539 Pain in unspecified wrist: Secondary | ICD-10-CM

## 2013-12-17 MED ORDER — PREDNISONE 10 MG PO TABS: ORAL_TABLET | ORAL | Status: DC

## 2013-12-17 NOTE — Patient Instructions (Signed)
You likely have Carpal Tunnel. Please start wearing a brace unless you are at work. Including at night.  Please start taking either Aleve 1-2 tabs twice a day or ibuprofen 600-800mg  every 6-8 hours.  Please also ice your wrist as needed  Please start the steroids if you are not any better in 1 week and follow up in 4 weeks if you have not improved.   Carpal Tunnel Syndrome The carpal tunnel is a narrow area located on the palm side of your wrist. The tunnel is formed by the wrist bones and ligaments. Nerves, blood vessels, and tendons pass through the carpal tunnel. Repeated wrist motion or certain diseases may cause swelling within the tunnel. This swelling pinches the main nerve in the wrist (median nerve) and causes the painful hand and arm condition called carpal tunnel syndrome. CAUSES   Repeated wrist motions.  Wrist injuries.  Certain diseases like arthritis, diabetes, alcoholism, hyperthyroidism, and kidney failure.  Obesity.  Pregnancy. SYMPTOMS   A "pins and needles" feeling in your fingers or hand, especially in your thumb, index and middle fingers.  Tingling or numbness in your fingers or hand.  An aching feeling in your entire arm, especially when your wrist and elbow are bent for long periods of time.  Wrist pain that goes up your arm to your shoulder.  Pain that goes down into your palm or fingers.  A weak feeling in your hands. DIAGNOSIS  Your health care provider will take your history and perform a physical exam. An electromyography test may be needed. This test measures electrical signals sent out by your nerves into the muscles. The electrical signals are usually slowed by carpal tunnel syndrome. You may also need X-rays. TREATMENT  Carpal tunnel syndrome may clear up by itself. Your health care provider may recommend a wrist splint or medicine such as a nonsteroidal anti-inflammatory medicine. Cortisone injections may help. Sometimes, surgery may be needed to  free the pinched nerve.  HOME CARE INSTRUCTIONS   Take all medicine as directed by your health care provider. Only take over-the-counter or prescription medicines for pain, discomfort, or fever as directed by your health care provider.  If you were given a splint to keep your wrist from bending, wear it as directed. It is important to wear the splint at night. Wear the splint for as long as you have pain or numbness in your hand, arm, or wrist. This may take 1 to 2 months.  Rest your wrist from any activity that may be causing your pain. If your symptoms are work-related, you may need to talk to your employer about changing to a job that does not require using your wrist.  Put ice on your wrist after long periods of wrist activity.  Put ice in a plastic bag.  Place a towel between your skin and the bag.  Leave the ice on for 15-20 minutes, 03-04 times a day.  Keep all follow-up visits as directed by your health care provider. This includes any orthopedic referrals, physical therapy, and rehabilitation. Any delay in getting necessary care could result in a delay or failure of your condition to heal. SEEK IMMEDIATE MEDICAL CARE IF:   You have new, unexplained symptoms.  Your symptoms get worse and are not helped or controlled with medicines. MAKE SURE YOU:   Understand these instructions.  Will watch your condition.  Will get help right away if you are not doing well or get worse. Document Released: 04/09/2000 Document Revised: 08/27/2013 Document  Reviewed: 02/26/2011 ExitCare Patient Information 2015 [place], [place]. This information is not intended to replace advice given to you by your health care provider. Make sure you discuss any questions you have with your health care provider.

## 2013-12-17 NOTE — Assessment & Plan Note (Signed)
Likely early carpel tunnel Pt to purchase wrist splint adn continue ice and ROM exercises Try NSAIDs, but if no relief the try course of oral steroids Avoid exacerbating activities.  F/u in 4 wks if not better Consider imaging vs steroid injection if not improving.

## 2013-12-17 NOTE — Progress Notes (Signed)
[name] is a 53 y.o. female who presents to [facility] today for hand numbness  Hand Numbness: R thumb initially but now including the 2-3 fingers as well. Started last week w/ wrist pain. Worse w/ certain movements. Just started lifting wts and pistol shooting which were all new activities for the pt. Tried wrapping w/o benefit. Yesterday woke up from nap w /numb hand. Tried ice and heat w/o benefit. No longer hurting but numb. Ibuprofen 400 w/ some benefit. No previous trauma. Hair dresser for 34 years.     The following portions of the patient's history were reviewed and updated as appropriate: allergies, current medications, past medical history, family and social history, and problem list.  Patient is a nonsmoker.    Past Medical History  Diagnosis Date  . Anxiety   . Allergy   . McArdle's disease     ROS as above otherwise neg.    Medications reviewed. Current Outpatient Prescriptions  Medication Sig Dispense Refill  . ALPRAZolam (XANAX) 0.5 MG tablet Take 0.5 mg by mouth at bedtime as needed for sleep.      Marland Kitchen buPROPion (WELLBUTRIN SR) 150 MG 12 hr tablet Take 150 mg by mouth daily.      Marland Kitchen escitalopram (LEXAPRO) 20 MG tablet Take 10 mg by mouth 2 (two) times daily.      . Multiple Vitamins-Minerals (MULTIVITAMIN WITH MINERALS) tablet Take 1 tablet by mouth daily.      . predniSONE (DELTASONE) 10 MG tablet Take 5 x 5 days, 3 x 3 day, 1 x 3 days.  37 tablet  0   No current facility-administered medications for this visit.    Exam:  BP 107/70  Pulse 81  Temp(Src) 97.7 F (36.5 C) (Oral)  Ht  (1.6 m)  Wt 149 lb 9.6 oz (67.858 kg)  BMI 26.51 kg/m2 Gen: Well NAD HEENT: EOMI,  MMM MSK: Tinel's test + on R. Grip strength 5/5 bilat   No results found for this or any previous visit (from the past 72 hour(s)).  A/P (as seen in Problem list)  Wrist pain Likely early carpel tunnel Pt to purchase wrist splint adn continue ice and ROM exercises Try NSAIDs, but if no  relief the try course of oral steroids Avoid exacerbating activities.  F/u in 4 wks if not better Consider imaging vs steroid injection if not improving.   CRP ordered per GYN office

## 2013-12-18 LAB — C-REACTIVE PROTEIN: CRP: 14.1 mg/L — AB (ref 0.0–4.9)

## 2013-12-27 ENCOUNTER — Encounter: Payer: Self-pay | Admitting: Family Medicine

## 2013-12-28 ENCOUNTER — Telehealth: Payer: Self-pay | Admitting: Family Medicine

## 2013-12-30 NOTE — Telephone Encounter (Signed)
Patient concerned about recently elevated CRP level. She is aware that it can be due to the inflammation in her wrist but states that she has had an elevated CRP in the past. Explained through My Chart message that this is a nonspecific test that can elevate with any inflammation in the body. Please review most recent office note by Dr Konrad Dolores and CRP result and advise on how to respond to this question.   This was responded to via a My Chart message and forwarded to Dr. Christell Constant for further review and explanation. I will forward this message as well.

## 2013-12-30 NOTE — Telephone Encounter (Signed)
The patient's McArdle's disease is most likely causing inflammation in her muscles which in turn is showing with an elevated CRP or C-reactive protein

## 2014-01-01 NOTE — Telephone Encounter (Signed)
Left detailed message on voicemail.  

## 2014-01-18 ENCOUNTER — Telehealth: Payer: Self-pay | Admitting: Family Medicine

## 2014-01-18 NOTE — Telephone Encounter (Signed)
appt scheduled for 10/8 with [facility]

## 2014-01-31 ENCOUNTER — Encounter: Payer: Self-pay | Admitting: Family Medicine

## 2014-01-31 ENCOUNTER — Ambulatory Visit (INDEPENDENT_AMBULATORY_CARE_PROVIDER_SITE_OTHER): Payer: BC Managed Care – PPO

## 2014-01-31 ENCOUNTER — Ambulatory Visit (INDEPENDENT_AMBULATORY_CARE_PROVIDER_SITE_OTHER): Payer: BC Managed Care – PPO | Admitting: Family Medicine

## 2014-01-31 VITALS — BP 104/70 | HR 89 | Temp 99.1°F | Ht 63.0 in | Wt 149.0 lb

## 2014-01-31 DIAGNOSIS — M25531 Pain in right wrist: Secondary | ICD-10-CM

## 2014-01-31 NOTE — Progress Notes (Signed)
Patient ID: [name], female   DOB: 1960-06-19, 53 y.o.   MRN: [id number] No chief complaint on file.   HPI Comments: Continues with pain in the right wrist but it has improved. She did not wear splints and she is taking Aleve. Her occupation as a Interior and spatial designerhairdresser.   Past Medical History  Diagnosis Date  . Anxiety   . Allergy   . McArdle's disease     History   Social History  . Marital Status: Married    Spouse Name: N/A    Number of Children: N/A  . Years of Education: N/A   Occupational History  . Not on file.   Social History Main Topics  . Smoking status: Former Smoker    Quit date: 04/26/1992  . Smokeless tobacco: Not on file  . Alcohol Use: Yes  . Drug Use: No  . Sexual Activity: Not on file   Other Topics Concern  . Not on file   Social History Narrative  . No narrative on file    No family history on file.  Review of Systems  Constitutional: Negative.   HENT: Negative.   Eyes: Negative.   Respiratory: Negative.   Cardiovascular: Negative.   Gastrointestinal: Negative.   Endocrine: Negative.   Genitourinary: Negative.   Musculoskeletal: Positive for myalgias.       No nighttime wakening Denies numbness or tingling at this visit  Skin: Rash: not true rash but healing intertrigo.  Hematological: Negative.   Psychiatric/Behavioral: Negative.     Physical Exam  Musculoskeletal:  Right wrist: Tenderness at base of thumb and wrist Tinel and Phalen's tests are negative    X-ray neg  AP: Suspect more radial tenosynovitis and carpal tunnel Thumb spica splint to wear most of the time with Aleve as long as it doesn't upset stomach Return as needed Frederica KusterStephen M Markeith Jue MD

## 2014-08-28 ENCOUNTER — Encounter: Payer: Self-pay | Admitting: Nurse Practitioner

## 2014-08-28 ENCOUNTER — Ambulatory Visit (INDEPENDENT_AMBULATORY_CARE_PROVIDER_SITE_OTHER): Payer: BLUE CROSS/BLUE SHIELD | Admitting: Nurse Practitioner

## 2014-08-28 VITALS — BP 115/77 | HR 71 | Temp 97.1°F | Ht 63.0 in | Wt 146.0 lb

## 2014-08-28 DIAGNOSIS — R300 Dysuria: Secondary | ICD-10-CM

## 2014-08-28 LAB — POCT UA - MICROSCOPIC ONLY
Bacteria, U Microscopic: NEGATIVE
CRYSTALS, UR, HPF, POC: NEGATIVE
Casts, Ur, LPF, POC: NEGATIVE
MUCUS UA: NEGATIVE
Yeast, UA: NEGATIVE

## 2014-08-28 LAB — POCT URINALYSIS DIPSTICK
Glucose, UA: NEGATIVE
KETONES UA: NEGATIVE
LEUKOCYTES UA: NEGATIVE
Nitrite, UA: NEGATIVE
Protein, UA: NEGATIVE
Spec Grav, UA: 1.02
Urobilinogen, UA: NEGATIVE
pH, UA: 6

## 2014-08-28 MED ORDER — NITROFURANTOIN MONOHYD MACRO 100 MG PO CAPS: 100.0000 mg | ORAL_CAPSULE | Freq: Two times a day (BID) | ORAL | Status: DC

## 2014-08-28 NOTE — Progress Notes (Signed)
  Subjective:    [name] is a 54 y.o. female who complains of burning with urination, dysuria, bilateral flank pain and foul smelling urine. She has had symptoms for 3 days. Patient also complains of back pain. Patient denies fever. Patient does have a history of recurrent UTI. Patient does not have a history of pyelonephritis.   The following portions of the patient's history were reviewed and updated as appropriate: allergies, current medications, past family history, past medical history, past social history, past surgical history and problem list.  Review of Systems Pertinent items are noted in HPI.    Objective:    BP 115/77 mmHg  Pulse 71  Temp(Src) 97.1 F (36.2 C) (Oral)  Ht 5\' 3"  (1.6 m)  Wt 146 lb (66.225 kg)  BMI 25.87 kg/m2 Lungs: clear to auscultation bilaterally Heart: regular rate and rhythm, S1, S2 normal, no murmur, click, rub or gallop Abdomen: soft, non-tender; bowel sounds normal; no masses,  no organomegaly. No CVA tenderness bil  Laboratory:    Results for orders placed or performed in visit on 08/28/14  POCT UA - Microscopic Only  Result Value Ref Range   WBC, Ur, HPF, POC occ    RBC, urine, microscopic 1-3    Bacteria, U Microscopic neg    Mucus, UA neg    Epithelial cells, urine per micros occ    Crystals, Ur, HPF, POC neg    Casts, Ur, LPF, POC neg    Yeast, UA neg   POCT urinalysis dipstick  Result Value Ref Range   Color, UA gold    Clarity, UA clear    Glucose, UA neg    Bilirubin, UA small    Ketones, UA neg    Spec Grav, UA 1.020    Blood, UA small    pH, UA 6.0    Protein, UA neg    Urobilinogen, UA negative    Nitrite, UA neg    Leukocytes, UA Negative     Assessment:    dysuria     Plan:   Meds ordered this encounter  Medications  . nitrofurantoin, macrocrystal-monohydrate, (MACROBID) 100 MG capsule    Sig: Take 1 capsule (100 mg total) by mouth 2 (two) times daily. 1 po BId    Dispense:  14 capsule    Refill:  0     Order Specific Question:  Supervising Provider    Answer:  Ernestina PennaMOORE, DONALD W [[id number]]   Take medication as prescribe Cotton underwear Take shower not bath Cranberry juice, yogurt Force fluids AZO over the counter X2 days Culture pending RTO prn  Claire Daphine DeutscherMartin, FNP

## 2014-08-28 NOTE — Patient Instructions (Signed)
Take medication as prescribe Cotton underwear Take shower not bath Cranberry juice, yogurt Force fluids AZO over the counter X2 days Culture pending RTO prn  

## 2014-10-15 ENCOUNTER — Other Ambulatory Visit: Payer: Self-pay

## 2014-10-15 DIAGNOSIS — Z1231 Encounter for screening mammogram for malignant neoplasm of breast: Secondary | ICD-10-CM

## 2014-10-21 ENCOUNTER — Telehealth: Payer: Self-pay | Admitting: Family Medicine

## 2014-11-04 ENCOUNTER — Ambulatory Visit
Admission: RE | Admit: 2014-11-04 | Discharge: 2014-11-04 | Disposition: A | Discharge status: Home / Self Care | Payer: BLUE CROSS/BLUE SHIELD | Source: Ambulatory Visit

## 2014-11-04 DIAGNOSIS — Z1231 Encounter for screening mammogram for malignant neoplasm of breast: Secondary | ICD-10-CM

## 2015-05-08 ENCOUNTER — Encounter: Payer: Self-pay | Admitting: Nurse Practitioner

## 2015-05-08 ENCOUNTER — Ambulatory Visit (INDEPENDENT_AMBULATORY_CARE_PROVIDER_SITE_OTHER): Payer: BC Managed Care – PPO | Admitting: Nurse Practitioner

## 2015-05-08 VITALS — BP 125/77 | HR 86 | Temp 97.4°F | Ht 63.0 in | Wt 159.0 lb

## 2015-05-08 DIAGNOSIS — R300 Dysuria: Secondary | ICD-10-CM | POA: Diagnosis not present

## 2015-05-08 DIAGNOSIS — N3001 Acute cystitis with hematuria: Secondary | ICD-10-CM

## 2015-05-08 LAB — POCT UA - MICROSCOPIC ONLY
CASTS, UR, LPF, POC: NEGATIVE
Crystals, Ur, HPF, POC: NEGATIVE
Mucus, UA: NEGATIVE
Yeast, UA: NEGATIVE

## 2015-05-08 LAB — POCT URINALYSIS DIPSTICK
Bilirubin, UA: NEGATIVE
Glucose, UA: NEGATIVE
Ketones, UA: NEGATIVE
NITRITE UA: NEGATIVE
PH UA: 5
Spec Grav, UA: 1.01
Urobilinogen, UA: NEGATIVE

## 2015-05-08 MED ORDER — CIPROFLOXACIN HCL 500 MG PO TABS: 500.0000 mg | ORAL_TABLET | Freq: Two times a day (BID) | ORAL | Status: DC

## 2015-05-08 NOTE — Progress Notes (Signed)
  Subjective:    [name] is a 55 y.o. female who complains of burning with urination, constipation, frequency, suprapubic pressure and slight back pain. She has had symptoms for 2 days. Patient also complains of back pain. Patient denies vaginal discharge. Patient does not have a history of recurrent UTI. Patient does not have a history of pyelonephritis.   The following portions of the patient's history were reviewed and updated as appropriate: allergies, current medications, past family history, past medical history, past social history, past surgical history and problem list.  Review of Systems Pertinent items are noted in HPI.    Objective:    BP 125/77 mmHg  Pulse 86  Temp(Src) 97.4 F (36.3 C) (Oral)  Ht 5\' 3"  (1.6 m)  Wt 159 lb (72.122 kg)  BMI 28.17 kg/m2 Lungs: clear to auscultation bilaterally Heart: regular rate and rhythm, S1, S2 normal, no murmur, click, rub or gallop Abdomen: soft, non-tender; bowel sounds normal; no masses,  no organomegaly Pelvic: slight suprapubic pain on palpation  Laboratory:  Results for orders placed or performed in visit on 05/08/15  POCT urinalysis dipstick  Result Value Ref Range   Color, UA gold    Clarity, UA clear    Glucose, UA negative    Bilirubin, UA negative    Ketones, UA negative    Spec Grav, UA 1.010    Blood, UA large    pH, UA 5.0    Protein, UA trace    Urobilinogen, UA negative    Nitrite, UA negative    Leukocytes, UA small (1+) (A) Negative  POCT UA - Microscopic Only  Result Value Ref Range   WBC, Ur, HPF, POC 230-40    RBC, urine, microscopic 5-10    Bacteria, U Microscopic moderate    Mucus, UA negative    Epithelial cells, urine per micros few    Crystals, Ur, HPF, POC negative    Casts, Ur, LPF, POC negative    Yeast, UA negative        Assessment:    Acute cystitis     Plan:   Take medication as prescribe Cotton underwear Take shower not bath Cranberry juice, yogurt Force  fluids AZO over the counter X2 days Culture pending RTO prn Meds ordered this encounter  Medications  . ciprofloxacin (CIPRO) 500 MG tablet    Sig: Take 1 tablet (500 mg total) by mouth 2 (two) times daily.    Dispense:  20 tablet    Refill:  0    Order Specific Question:  Supervising Provider    Answer:  Ernestina PennaMOORE, DONALD W [[id number]]   Claire Daphine DeutscherMartin, FNP

## 2015-05-08 NOTE — Patient Instructions (Signed)
Take medication as prescribe Cotton underwear Take shower not bath Cranberry juice, yogurt Force fluids AZO over the counter X2 days Culture pending RTO prn  

## 2015-05-10 LAB — URINE CULTURE

## 2015-06-05 ENCOUNTER — Emergency Department (HOSPITAL_BASED_OUTPATIENT_CLINIC_OR_DEPARTMENT_OTHER): Payer: BC Managed Care – PPO

## 2015-06-05 ENCOUNTER — Emergency Department (HOSPITAL_BASED_OUTPATIENT_CLINIC_OR_DEPARTMENT_OTHER)
Admission: EM | Admit: 2015-06-05 | Discharge: 2015-06-05 | Disposition: A | Discharge status: Home / Self Care | Payer: BC Managed Care – PPO | Attending: Emergency Medicine | Admitting: Emergency Medicine

## 2015-06-05 ENCOUNTER — Encounter (HOSPITAL_BASED_OUTPATIENT_CLINIC_OR_DEPARTMENT_OTHER): Payer: Self-pay | Admitting: Emergency Medicine

## 2015-06-05 DIAGNOSIS — Z87891 Personal history of nicotine dependence: Secondary | ICD-10-CM | POA: Insufficient documentation

## 2015-06-05 DIAGNOSIS — R51 Headache: Secondary | ICD-10-CM | POA: Diagnosis not present

## 2015-06-05 DIAGNOSIS — Z792 Long term (current) use of antibiotics: Secondary | ICD-10-CM | POA: Insufficient documentation

## 2015-06-05 DIAGNOSIS — F419 Anxiety disorder, unspecified: Secondary | ICD-10-CM | POA: Diagnosis not present

## 2015-06-05 DIAGNOSIS — Z8639 Personal history of other endocrine, nutritional and metabolic disease: Secondary | ICD-10-CM | POA: Insufficient documentation

## 2015-06-05 DIAGNOSIS — Z79899 Other long term (current) drug therapy: Secondary | ICD-10-CM | POA: Insufficient documentation

## 2015-06-05 DIAGNOSIS — R519 Headache, unspecified: Secondary | ICD-10-CM

## 2015-06-05 MED ORDER — HYDROCODONE-ACETAMINOPHEN 5-325 MG PO TABS
1.0000 | ORAL_TABLET | Freq: Once | ORAL | Status: AC
  Administered 2015-06-05: 1 via ORAL
  Filled 2015-06-05: qty 1

## 2015-06-05 NOTE — Discharge Instructions (Signed)
1. Medications: continue usual home medications 2. Treatment: rest, drink plenty of fluids, if headache persists take ibuprofen with caffeine 3. Follow Up: Please follow up with your primary doctor in 3 days for discussion of your diagnoses and further evaluation after today's visit; Please return to the ER for double vision, speech difficulty, gait disturbance, persistent vomiting or other concerns.

## 2015-06-05 NOTE — ED Notes (Signed)
Headache suddenly started at 530pm. Denies other symptoms.

## 2015-06-05 NOTE — ED Notes (Signed)
Pt verbalizes understanding of d/c instructions and denies any further needs at this time. 

## 2015-06-05 NOTE — ED Notes (Signed)
Pt c/o sudden headache at 1730 today, no hx of headaches, not on blood thinners and no recent head injury.  Headache relieved slightly by tylenol.  Pt is WNL neurologically, denies weakness, denies numbness, no dizziness or confusion.

## 2015-06-05 NOTE — ED Provider Notes (Signed)
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 Headache started at occipital area subsequently moved to frontal area and to entire head. Headache gradually became worse over several hours. She's not had similar headaches in the past. Patient is pleasant alert cooperative Glasgow coma score 15 appears in no distress.  Doug Sou, MD 06/05/15 [phone]

## 2015-06-06 NOTE — ED Provider Notes (Signed)
CSN: [id number]     Arrival date & time 06/05/15  1941 History   First MD Initiated Contact with Patient 06/05/15 2153     Chief Complaint  Patient presents with  . Headache     (Consider location/radiation/quality/duration/timing/severity/associated sxs/prior Treatment) Patient is a 55 y.o. female presenting with headaches. The history is provided by the patient and medical records. No language interpreter was used.  Headache Associated symptoms: no abdominal pain, no back pain, no congestion, no cough, no dizziness, no fever, no nausea, no neck stiffness, no vomiting and no weakness    [name] is a 55 y.o. female  who presents to the Emergency Department complaining of headache beginning at 5:30pm tonight which continued to worsen. Patient states pain is bilateral, starting occipitally and radiating all the way up to forehead. Denies visual changes, n/v, photophobia. No history of migraines. Tylenol taken with some relief.   Past Medical History  Diagnosis Date  . Anxiety   . Allergy   . McArdle's disease [facility])    Past Surgical History  Procedure Laterality Date  . Inner ear surgery    . Cesarean section     Family History  Problem Relation Age of Onset  . Diabetes Mother   . Heart disease Mother   . Diabetes Father   . Cirrhosis Father   . Depression Sister   . Depression Brother   . Cirrhosis Brother    Social History  Substance Use Topics  . Smoking status: Former Smoker    Quit date: 04/26/1992  . Smokeless tobacco: None  . Alcohol Use: Yes     Comment: occ   OB History    No data available     Review of Systems  Constitutional: Negative for fever and chills.  HENT: Negative for congestion.   Eyes: Negative for visual disturbance.  Respiratory: Negative for cough, shortness of breath and wheezing.   Cardiovascular: Negative.   Gastrointestinal: Negative for nausea, vomiting and abdominal pain.  Genitourinary: Negative for dysuria.   Musculoskeletal: Negative for back pain and neck stiffness.  Skin: Negative for rash.  Neurological: Positive for headaches. Negative for dizziness and weakness.      Allergies  Bactrim  Home Medications   Prior to Admission medications   Medication Sig Start Date End Date Taking? Authorizing Provider  ALPRAZolam Prudy Feeler) 0.5 MG tablet Take 0.5 mg by mouth at bedtime as needed for sleep.    Historical Provider, MD  buPROPion (WELLBUTRIN SR) 150 MG 12 hr tablet Take 150 mg by mouth daily.    Historical Provider, MD  ciprofloxacin (CIPRO) 500 MG tablet Take 1 tablet (500 mg total) by mouth 2 (two) times daily. 05/08/15   Mary-Margaret Daphine Deutscher, FNP  PREMARIN vaginal cream Reported on 05/08/2015 12/24/13   Historical Provider, MD  Vortioxetine HBr 10 MG TABS Take by mouth daily.    Historical Provider, MD   BP 136/82 mmHg  Pulse 85  Temp(Src) 97.6 F (36.4 C) (Oral)  Resp 18  Ht  (1.6 m)  Wt 70.308 kg  BMI 27.46 kg/m2  SpO2 99% Physical Exam  Constitutional: She is oriented to person, place, and time. She appears well-developed and well-nourished. No distress.  HENT:  Head: Normocephalic and atraumatic.  Mouth/Throat: Oropharynx is clear and moist.  No tenderness of the temporal artery   Eyes: Conjunctivae and EOM are normal. Pupils are equal, round, and reactive to light. No scleral icterus.  No nystagmus   Neck: Normal range of motion. Neck  supple.  Full active and passive ROM without pain.  No midline or paraspinal tenderness. No nuchal rigidity or meningeal signs.  Cardiovascular: Normal rate, regular rhythm, normal heart sounds and intact distal pulses.   Pulmonary/Chest: Effort normal and breath sounds normal. No respiratory distress. She has no wheezes. She has no rales.  Abdominal: Soft. Bowel sounds are normal. There is no tenderness. There is no rebound and no guarding.  Musculoskeletal: Normal range of motion.  Lymphadenopathy:    She has no cervical adenopathy.   Neurological: She is alert and oriented to person, place, and time. She has normal reflexes. No cranial nerve deficit. Coordination normal.  Mental Status: Alert, oriented, and thought content is appropriate. Speech is fluent without evidence of aphasia. Able to follow two-step commands without difficulty.  Cranial Nerves:  II - Peripheral visual fields grossly normal, pupils equal, round, reactive to light III, IV, VI - Bilateral EOM intact, no ptosis V - Facial light touch sensation intact and equal VII - Facial symmetry: smile, raised eyebrows ; Eyelids kept closed against resistance VIII - Hearing grossly normal bilaterally  IX, X - Uvula midline XI - Bilateral shoulder shrug equal and strong XII - Tongue extension midline Motor:  5/5 muscle strength of upper and lower extremities bilaterally including strong and equal grip strength and plantar/dorsiflexion.  Sensory:  Light touch sensory intact.   Skin: Skin is warm and dry. No rash noted. She is not diaphoretic.  Psychiatric: She has a normal mood and affect. Her behavior is normal. Judgment and thought content normal.  Nursing note and vitals reviewed.   ED Course  Procedures (including critical care time) Labs Review Labs Reviewed - No data to display  Imaging Review Ct Head Wo Contrast  06/05/2015  CLINICAL DATA:  Severe headache for 5 hours. EXAM: CT HEAD WITHOUT CONTRAST TECHNIQUE: Contiguous axial images were obtained from the base of the skull through the vertex without intravenous contrast. COMPARISON:  None. FINDINGS: No evidence for acute infarction, hemorrhage, mass lesion, hydrocephalus, or extra-axial fluid. Normal for age cerebral volume. No white matter disease. Calvarium intact.  No acute paranasal sinus disease. RIGHT middle ear and mastoid normal. 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 No layering fluid in the LEFT mastoid. Negative orbits. IMPRESSION: No acute intracranial  findings. Sequelae of previous LEFT mastoid infection. No acute paranasal sinus or otitic findings. Electronically Signed   By: Elsie Stain M.D.   On: 06/05/2015 22:38   I have personally reviewed and evaluated these images and lab results as part of my medical decision-making.   EKG Interpretation None      MDM   Final diagnoses:  Acute nonintractable headache, unspecified headache type   [name] presents with headache beginning this afternoon. No neuro deficits on exam. Patient states she does not get headaches - given age and lack of ha hx, will CT head. CT head with no acute findings. Headache improved in ED with pain meds. Will discharge patient to home in good and stable condition. Return precautions and follow up instructions given.   [facility] Berdina Cheever, PA-C 06/06/15 [id number]  Doug Sou, MD 06/07/15 [phone]

## 2015-07-09 ENCOUNTER — Telehealth: Payer: Self-pay | Admitting: Nurse Practitioner

## 2015-07-14 NOTE — Telephone Encounter (Signed)
denied °

## 2015-10-30 ENCOUNTER — Other Ambulatory Visit: Payer: Self-pay | Admitting: Obstetrics and Gynecology

## 2015-10-30 DIAGNOSIS — Z139 Encounter for screening, unspecified: Secondary | ICD-10-CM

## 2015-11-03 DIAGNOSIS — H6122 Impacted cerumen, left ear: Secondary | ICD-10-CM | POA: Insufficient documentation

## 2015-11-10 ENCOUNTER — Ambulatory Visit
Admission: RE | Admit: 2015-11-10 | Discharge: 2015-11-10 | Disposition: A | Discharge status: Home / Self Care | Payer: BC Managed Care – PPO | Source: Ambulatory Visit | Attending: Obstetrics and Gynecology | Admitting: Obstetrics and Gynecology

## 2015-11-10 DIAGNOSIS — Z139 Encounter for screening, unspecified: Secondary | ICD-10-CM

## 2016-01-15 ENCOUNTER — Ambulatory Visit (INDEPENDENT_AMBULATORY_CARE_PROVIDER_SITE_OTHER): Payer: BC Managed Care – PPO | Admitting: Nurse Practitioner

## 2016-01-15 ENCOUNTER — Encounter: Payer: Self-pay | Admitting: Nurse Practitioner

## 2016-01-15 VITALS — BP 118/78 | HR 82 | Temp 97.2°F | Ht 63.0 in | Wt 137.0 lb

## 2016-01-15 DIAGNOSIS — R079 Chest pain, unspecified: Secondary | ICD-10-CM

## 2016-01-15 MED ORDER — NITROGLYCERIN 0.4 MG SL SUBL: 0.4000 mg | SUBLINGUAL_TABLET | SUBLINGUAL | 0 refills | Status: DC | PRN

## 2016-01-15 NOTE — Patient Instructions (Signed)
Chest Wall Pain °Chest wall pain is pain in or around the bones and muscles of your chest. Sometimes, an injury causes this pain. Sometimes, the cause may not be known. This pain may take several weeks or longer to get better. °HOME CARE °Pay attention to any changes in your symptoms. Take these actions to help with your pain: °· Rest as told by your doctor. °· Avoid activities that cause pain. Try not to use your chest, belly (abdominal), or side muscles to lift heavy things. °· If directed, apply ice to the painful area: °¨ Put ice in a plastic bag. °¨ Place a towel between your skin and the bag. °¨ Leave the ice on for 20 minutes, 2-3 times per day. °· Take over-the-counter and prescription medicines only as told by your doctor. °· Do not use tobacco products, including cigarettes, chewing tobacco, and e-cigarettes. If you need help quitting, ask your doctor. °· Keep all follow-up visits as told by your doctor. This is important. °GET HELP IF: °· You have a fever. °· Your chest pain gets worse. °· You have new symptoms. °GET HELP RIGHT AWAY IF: °· You feel sick to your stomach (nauseous) or you throw up (vomit). °· You feel sweaty or light-headed. °· You have a cough with phlegm (sputum) or you cough up blood. °· You are short of breath. °  °This information is not intended to replace advice given to you by your health care provider. Make sure you discuss any questions you have with your health care provider. °  °Document Released: 09/29/2007 Document Revised: 01/01/2015 Document Reviewed: 07/08/2014 °[employer]vier Interactive Patient Education ©2016 [employer] ° °

## 2016-01-15 NOTE — Progress Notes (Signed)
Subjective:    Patient ID: [name], female    DOB: 30-Sep-1960, 55 y.o.   MRN: [id number]  HPI  Patient in today c/o an episode of chest pain, left jaw and arm pain. Lasted 15-20 minutes. She was at church when it started. She felt a little short of breath during episode. No more episodes since then. Describes chest pain as heavy pain- rated it 7/10.  Review of Systems  Constitutional: Negative for activity change, appetite change and fatigue.  Respiratory: Positive for shortness of breath (only when having chest pain on Sunday).   Cardiovascular: Positive for chest pain.  Gastrointestinal: Negative.   Genitourinary: Negative.   Neurological: Positive for dizziness and headaches.  Psychiatric/Behavioral: Negative.        Objective:   Physical Exam  Constitutional: She is oriented to person, place, and time. She appears well-developed and well-nourished. No distress.  HENT:  Right Ear: External ear normal.  Left Ear: External ear normal.  Nose: Nose normal.  Mouth/Throat: Oropharynx is clear and moist.  Neck: Normal range of motion. Neck supple.  Cardiovascular: Normal rate, regular rhythm and normal heart sounds.   Pulmonary/Chest: Effort normal and breath sounds normal.  Neurological: She is alert and oriented to person, place, and time.  Skin: Skin is warm.  Psychiatric: She has a normal mood and affect. Her behavior is normal. Judgment and thought content normal.   BP 118/78   Pulse 82   Temp 97.2 F (36.2 C) (Oral)   Ht 5\' 3"  (1.6 m)   Wt 137 lb (62.1 kg)   BMI 24.27 kg/m    EKG- NSR-Mary-Margaret Daphine Deutscher, FNP      Assessment & Plan:   1. Chest pain, unspecified chest pain type    Orders Placed This Encounter  Procedures  . Ambulatory referral to Cardiology    Referral Priority:   Urgent    Referral Type:   Consultation    Referral Reason:   Specialty Services Required    Requested Specialty:   Cardiology    Number of Visits Requested:   1  . EKG  12-Lead   Meds ordered this encounter  Medications  . nitroGLYCERIN (NITROSTAT) 0.4 MG SL tablet    Sig: Place 1 tablet (0.4 mg total) under the tongue every 5 (five) minutes as needed for chest pain.    Dispense:  30 tablet    Refill:  0    Order Specific Question:   Supervising Provider    Answer:   Johna Sheriff [[id number]]    Avoid all caffeine If chest pain reoccurs try nitro and call 911 RTO prn  Mary-Margaret Daphine Deutscher, FNP

## 2016-01-15 NOTE — Addendum Note (Signed)
Addended by: Bennie PieriniMARTIN, MARY-MARGARET on: 01/15/2016 11:10 AM   Modules accepted: Orders

## 2016-01-16 LAB — CMP14+EGFR
A/G RATIO: 2.2 (ref 1.2–2.2)
ALT: 21 IU/L (ref 0–32)
AST: 26 IU/L (ref 0–40)
Albumin: 4.4 g/dL (ref 3.5–5.5)
Alkaline Phosphatase: 81 IU/L (ref 39–117)
BUN/Creatinine Ratio: 18 (ref 9–23)
BUN: 16 mg/dL (ref 6–24)
Bilirubin Total: 0.4 mg/dL (ref 0.0–1.2)
CALCIUM: 9.3 mg/dL (ref 8.7–10.2)
CO2: 27 mmol/L (ref 18–29)
CREATININE: 0.88 mg/dL (ref 0.57–1.00)
Chloride: 102 mmol/L (ref 96–106)
GFR, EST AFRICAN AMERICAN: 85 mL/min/{1.73_m2} (ref >59)
GFR, EST NON AFRICAN AMERICAN: 74 mL/min/{1.73_m2} (ref >59)
GLOBULIN, TOTAL: 2 g/dL (ref 1.5–4.5)
Glucose: 88 mg/dL (ref 65–99)
POTASSIUM: 5.1 mmol/L (ref 3.5–5.2)
Sodium: 142 mmol/L (ref 134–144)
TOTAL PROTEIN: 6.4 g/dL (ref 6.0–8.5)

## 2016-01-16 LAB — LIPID PANEL
CHOL/HDL RATIO: 2.6 ratio (ref 0.0–4.4)
Cholesterol, Total: 163 mg/dL (ref 100–199)
HDL: 63 mg/dL (ref >39)
LDL CALC: 78 mg/dL (ref 0–99)
TRIGLYCERIDES: 111 mg/dL (ref 0–149)
VLDL Cholesterol Cal: 22 mg/dL (ref 5–40)

## 2016-02-02 ENCOUNTER — Encounter: Payer: Self-pay | Admitting: Physician Assistant

## 2016-02-02 ENCOUNTER — Ambulatory Visit (INDEPENDENT_AMBULATORY_CARE_PROVIDER_SITE_OTHER): Payer: BC Managed Care – PPO | Admitting: Physician Assistant

## 2016-02-02 VITALS — BP 108/75 | HR 79 | Temp 97.8°F | Ht 63.0 in | Wt 136.2 lb

## 2016-02-02 DIAGNOSIS — R05 Cough: Secondary | ICD-10-CM | POA: Diagnosis not present

## 2016-02-02 DIAGNOSIS — R52 Pain, unspecified: Secondary | ICD-10-CM

## 2016-02-02 DIAGNOSIS — J209 Acute bronchitis, unspecified: Secondary | ICD-10-CM | POA: Diagnosis not present

## 2016-02-02 DIAGNOSIS — R059 Cough, unspecified: Secondary | ICD-10-CM

## 2016-02-02 LAB — VERITOR FLU A/B WAIVED
Influenza A: NEGATIVE
Influenza B: NEGATIVE

## 2016-02-02 MED ORDER — AZITHROMYCIN 250 MG PO TABS: ORAL_TABLET | ORAL | 0 refills | Status: DC

## 2016-02-02 MED ORDER — HYDROCODONE-HOMATROPINE 5-1.5 MG/5ML PO SYRP: 5.0000 mL | ORAL_SOLUTION | Freq: Four times a day (QID) | ORAL | 0 refills | Status: DC | PRN

## 2016-02-02 MED ORDER — METHYLPREDNISOLONE ACETATE 80 MG/ML IJ SUSP
40.0000 mg | Freq: Once | INTRAMUSCULAR | Status: AC
  Administered 2016-02-02: 40 mg via INTRAMUSCULAR

## 2016-02-02 NOTE — Progress Notes (Signed)
BP 108/75   Pulse 79   Temp 97.8 F (36.6 C) (Oral)   Ht 5\' 3"  (1.6 m)   Wt 136 lb 3.2 oz (61.8 kg)   BMI 24.13 kg/m    Subjective:    Patient ID: [name], female    DOB: [birth year], 55 y.o.   MRN: [id number]  HPI: [name] is a 55 y.o. female presenting on 02/02/2016 for Fever (Started Thursday with fever 101.7); Cough; Generalized Body Aches; and Headache This patient has been sick for greater than 5 days. She was exposed to another person that was sick with bronchitis. The most remarkable thing was a very high fever last week over 101. She has had significant coughing throughout. She states that she is not productive at this time. She does have copious postnasal drainage and sore throat.   Relevant past medical, surgical, family and social history reviewed and updated as indicated. Interim medical history since our last visit reviewed. Allergies and medications reviewed and updated. DATA REVIEWED: CHART IN EPIC  Social History   Social History  . Marital status: Married    Spouse name: N/A  . Number of children: N/A  . Years of education: N/A   Occupational History  . Not on file.   Social History Main Topics  . Smoking status: Former Smoker    Quit date: 04/26/1992  . Smokeless tobacco: Never Used  . Alcohol use Yes     Comment: occ  . Drug use: No  . Sexual activity: Not on file   Other Topics Concern  . Not on file   Social History Narrative  . No narrative on file    Past Surgical History:  Procedure Laterality Date  . CESAREAN SECTION    . INNER EAR SURGERY      Family History  Problem Relation Age of Onset  . Diabetes Mother   . Heart disease Mother   . Diabetes Father   . Cirrhosis Father   . Depression Sister   . Depression Brother   . Cirrhosis Brother     Review of Systems  Constitutional: Negative.  Negative for activity change, fatigue and fever.  HENT: Positive for congestion, postnasal drip, rhinorrhea, sinus pressure  and sore throat.   Eyes: Negative.   Respiratory: Positive for cough and wheezing. Negative for chest tightness.   Cardiovascular: Negative.  Negative for chest pain.  Gastrointestinal: Negative.  Negative for abdominal pain.  Endocrine: Negative.   Genitourinary: Negative.  Negative for dysuria.  Musculoskeletal: Negative.   Skin: Negative.   Neurological: Negative.       Medication List       Accurate as of 02/02/16 10:00 AM. Always use your most recent med list.          ALPRAZolam 0.5 MG tablet Commonly known as:  XANAX Take 0.5 mg by mouth at bedtime as needed for sleep.   azithromycin 250 MG tablet Commonly known as:  ZITHROMAX Z-PAK As directed   buPROPion 150 MG 12 hr tablet Commonly known as:  WELLBUTRIN SR Take 150 mg by mouth daily.   HYDROcodone-homatropine 5-1.5 MG/5ML syrup Commonly known as:  HYCODAN Take 5-10 mLs by mouth every 6 (six) hours as needed for cough.   nitroGLYCERIN 0.4 MG SL tablet Commonly known as:  NITROSTAT Place 1 tablet (0.4 mg total) under the tongue every 5 (five) minutes as needed for chest pain.   PREMARIN vaginal cream Generic drug:  conjugated estrogens Reported on 05/08/2015   [facility]  20 MG Tabs Generic drug:  vortioxetine HBr          Objective:    BP 108/75   Pulse 79   Temp 97.8 F (36.6 C) (Oral)   Ht 5\' 3"  (1.6 m)   Wt 136 lb 3.2 oz (61.8 kg)   BMI 24.13 kg/m   Allergies  Allergen Reactions  . Bactrim [Sulfamethoxazole-Trimethoprim]     Wt Readings from Last 3 Encounters:  02/02/16 136 lb 3.2 oz (61.8 kg)  01/15/16 137 lb (62.1 kg)  06/05/15 155 lb (70.3 kg)    Physical Exam  Constitutional: She is oriented to person, place, and time. She appears well-developed and well-nourished.  HENT:  Head: Normocephalic and atraumatic.  Right Ear: There is drainage and tenderness.  Left Ear: There is drainage and tenderness.  Nose: Mucosal edema and rhinorrhea present. Right sinus exhibits maxillary  sinus tenderness. Right sinus exhibits no frontal sinus tenderness. Left sinus exhibits maxillary sinus tenderness. Left sinus exhibits no frontal sinus tenderness.  Mouth/Throat: Oropharyngeal exudate and posterior oropharyngeal erythema present.  Eyes: Conjunctivae and EOM are normal. Pupils are equal, round, and reactive to light.  Neck: Normal range of motion. Neck supple.  Cardiovascular: Normal rate, regular rhythm, normal heart sounds and intact distal pulses.   Pulmonary/Chest: Effort normal. No respiratory distress. She has wheezes in the right upper field.  Abdominal: Soft. Bowel sounds are normal.  Neurological: She is alert and oriented to person, place, and time. She has normal reflexes.  Skin: Skin is warm and dry. No rash noted.  Psychiatric: She has a normal mood and affect. Her behavior is normal. Judgment and thought content normal.  Nursing note and vitals reviewed.       Assessment & Plan:   1. Body aches - Veritor Flu A/B Waived  2. Acute bronchitis, unspecified organism - azithromycin (ZITHROMAX Z-PAK) 250 MG tablet; As directed  Dispense: 6 tablet; Refill: 0 - HYDROcodone-homatropine (HYCODAN) 5-1.5 MG/5ML syrup; Take 5-10 mLs by mouth every 6 (six) hours as needed for cough.  Dispense: 240 mL; Refill: 0  3. Cough - HYDROcodone-homatropine (HYCODAN) 5-1.5 MG/5ML syrup; Take 5-10 mLs by mouth every 6 (six) hours as needed for cough.  Dispense: 240 mL; Refill: 0 - methylPREDNISolone acetate (DEPO-MEDROL) injection 40 mg; Inject 0.5 mLs (40 mg total) into the muscle once.   Continue all other maintenance medications as listed above.  Follow up plan: Return if symptoms worsen or fail to improve.   Orders Placed This Encounter  Procedures  . Veritor Flu A/B [facility] given for bronchitis   Remus Loffler PA-C [facility] [address]  [place], [place] [address] [phone]   02/02/2016, 10:00 AM

## 2016-02-02 NOTE — Patient Instructions (Signed)

## 2016-02-04 ENCOUNTER — Ambulatory Visit (INDEPENDENT_AMBULATORY_CARE_PROVIDER_SITE_OTHER): Payer: BC Managed Care – PPO | Admitting: Physician Assistant

## 2016-02-04 ENCOUNTER — Encounter: Payer: Self-pay | Admitting: Physician Assistant

## 2016-02-04 VITALS — BP 93/62 | HR 72 | Temp 98.0°F | Ht 63.0 in | Wt 136.0 lb

## 2016-02-04 DIAGNOSIS — T7840XA Allergy, unspecified, initial encounter: Secondary | ICD-10-CM | POA: Diagnosis not present

## 2016-02-04 NOTE — Patient Instructions (Signed)

## 2016-02-06 NOTE — Progress Notes (Signed)
BP 93/62   Pulse 72   Temp 98 F (36.7 C) (Oral)   Ht 5\' 3"  (1.6 m)   Wt 136 lb (61.7 kg)   BMI 24.09 kg/m    Subjective:    Patient ID: [name], female    DOB: 12-01-1960, 55 y.o.   MRN: [id number]  HPI: [name] is a 55 y.o. female presenting on 02/04/2016 for Rash  About 3 days ago the patient started Zithromax and Hycodan cough syrup. She has fairly taken any of it. She has finished the Zithromax except for one more dosing. She has never had a rash related to a medication like this before. The area is under the skin and feels like bumps and itchy. She reports that she has taken Zithromax in the past without any issue. All of her medications are reviewed today. She is feeling better in regards to her recent bronchitis.   Relevant past medical, surgical, family and social history reviewed and updated as indicated. Interim medical history since our last visit reviewed. Allergies and medications reviewed and updated. DATA REVIEWED: CHART IN EPIC  Social History   Social History  . Marital status: Married    Spouse name: N/A  . Number of children: N/A  . Years of education: N/A   Occupational History  . Not on file.   Social History Main Topics  . Smoking status: Former Smoker    Quit date: 04/26/1992  . Smokeless tobacco: Never Used  . Alcohol use Yes     Comment: occ  . Drug use: No  . Sexual activity: Not on file   Other Topics Concern  . Not on file   Social History Narrative  . No narrative on file    Past Surgical History:  Procedure Laterality Date  . CESAREAN SECTION    . INNER EAR SURGERY      Family History  Problem Relation Age of Onset  . Diabetes Mother   . Heart disease Mother   . Diabetes Father   . Cirrhosis Father   . Depression Sister   . Depression Brother   . Cirrhosis Brother     Review of Systems  Constitutional: Negative.   HENT: Negative.   Eyes: Negative.   Respiratory: Negative.   Gastrointestinal:  Negative.   Genitourinary: Negative.   Skin: Positive for color change and rash.      Medication List       Accurate as of 02/04/16 11:59 PM. Always use your most recent med list.          ALPRAZolam 0.5 MG tablet Commonly known as:  XANAX Take 0.5 mg by mouth at bedtime as needed for sleep.   azithromycin 250 MG tablet Commonly known as:  ZITHROMAX Z-PAK As directed   buPROPion 150 MG 12 hr tablet Commonly known as:  WELLBUTRIN SR Take 150 mg by mouth daily.   nitroGLYCERIN 0.4 MG SL tablet Commonly known as:  NITROSTAT Place 1 tablet (0.4 mg total) under the tongue every 5 (five) minutes as needed for chest pain.   PREMARIN vaginal cream Generic drug:  conjugated estrogens Reported on 05/08/2015   TRINTELLIX 20 MG Tabs Generic drug:  vortioxetine HBr          Objective:    BP 93/62   Pulse 72   Temp 98 F (36.7 C) (Oral)   Ht 5\' 3"  (1.6 m)   Wt 136 lb (61.7 kg)   BMI 24.09 kg/m   Allergies  Allergen  Reactions  . Bactrim [Sulfamethoxazole-Trimethoprim]   . Hydrocodone Hives    Wt Readings from Last 3 Encounters:  02/04/16 136 lb (61.7 kg)  02/02/16 136 lb 3.2 oz (61.8 kg)  01/15/16 137 lb (62.1 kg)    Physical Exam  Constitutional: She is oriented to person, place, and time. She appears well-developed and well-nourished.  HENT:  Head: Normocephalic and atraumatic.  Eyes: Conjunctivae and EOM are normal. Pupils are equal, round, and reactive to light.  Cardiovascular: Normal rate, regular rhythm, normal heart sounds and intact distal pulses.   Pulmonary/Chest: Effort normal and breath sounds normal.  Abdominal: Soft. Bowel sounds are normal.  Neurological: She is alert and oriented to person, place, and time. She has normal reflexes.  Skin: Skin is warm and dry. Rash noted. Rash is maculopapular and urticarial. There is erythema.     Psychiatric: She has a normal mood and affect. Her behavior is normal. Judgment and thought content normal.    Nursing note and vitals reviewed.   Results for orders placed or performed in visit on 02/02/16  Veritor Flu A/B Waived  Result Value Ref Range   Influenza A Negative Negative   Influenza B Negative Negative      Assessment & Plan:   1. Allergic reaction, initial encounter Patient is to stop Hycodan. She can continue her Zithromax until completed. We have updated her record to show an allergic reaction to hydrocodone.   Continue all other maintenance medications as listed above.  Follow up plan: Return if symptoms worsen or fail to improve.  Educational handout given for allergic reaction  Remus Loffler PA-C [facility] [address]  [place], [place] [address] [phone]   02/06/2016, 1:42 PM

## 2016-02-12 ENCOUNTER — Encounter: Payer: Self-pay | Admitting: Family Medicine

## 2016-02-12 ENCOUNTER — Ambulatory Visit (INDEPENDENT_AMBULATORY_CARE_PROVIDER_SITE_OTHER): Payer: BC Managed Care – PPO

## 2016-02-12 ENCOUNTER — Ambulatory Visit (INDEPENDENT_AMBULATORY_CARE_PROVIDER_SITE_OTHER): Payer: BC Managed Care – PPO | Admitting: Family Medicine

## 2016-02-12 VITALS — BP 114/80 | HR 90 | Temp 97.1°F | Ht 63.0 in | Wt 134.0 lb

## 2016-02-12 DIAGNOSIS — J181 Lobar pneumonia, unspecified organism: Secondary | ICD-10-CM

## 2016-02-12 DIAGNOSIS — R0989 Other specified symptoms and signs involving the circulatory and respiratory systems: Secondary | ICD-10-CM

## 2016-02-12 DIAGNOSIS — J189 Pneumonia, unspecified organism: Secondary | ICD-10-CM

## 2016-02-12 MED ORDER — ALBUTEROL SULFATE HFA 108 (90 BASE) MCG/ACT IN AERS: 2.0000 | INHALATION_SPRAY | Freq: Four times a day (QID) | RESPIRATORY_TRACT | 0 refills | Status: DC | PRN

## 2016-02-12 MED ORDER — CEFTRIAXONE SODIUM 1 G IJ SOLR
1.0000 g | Freq: Once | INTRAMUSCULAR | Status: AC
  Administered 2016-02-12: 1 g via INTRAMUSCULAR

## 2016-02-12 NOTE — Progress Notes (Signed)
BP 114/80   Pulse 90   Temp 97.1 F (36.2 C) (Oral)   Ht 5\' 3"  (1.6 m)   Wt 134 lb (60.8 kg)   BMI 23.74 kg/m    Subjective:    Patient ID: [name], female    DOB: 06-May-1960, 55 y.o.   MRN: [id number]  HPI: Claire OdeaRenee Barnwell is a 55 y.o. female presenting on 02/12/2016 for Follow-up (bronchitis last week, zpack feels has not helped, back hurts some, still coughing but not productive, is going out of town)   HPI Chest congestion As complaints of persistent chest congestion and cough. She was here last week and was diagnosed with bronchitis and given a Z-Pak which she finished about 6 days ago and given Hycodan which she had to stop after a few days because she developed a rash. She says that she will get into coughing spells. The cough is mostly nonproductive but is occasionally productive. She denies any shortness of breath or wheezing. She does have a 15 year history of 2 pack per day smoking that she quit about 8 years ago.  Relevant past medical, surgical, family and social history reviewed and updated as indicated. Interim medical history since our last visit reviewed. Allergies and medications reviewed and updated.  Review of Systems  Constitutional: Negative for chills and fever.  HENT: Positive for congestion, postnasal drip, rhinorrhea, sinus pressure, sneezing and sore throat. Negative for ear discharge and ear pain.   Eyes: Negative for pain, redness and visual disturbance.  Respiratory: Positive for cough and chest tightness. Negative for shortness of breath and wheezing.   Cardiovascular: Negative for chest pain and leg swelling.  Genitourinary: Negative for difficulty urinating and dysuria.  Musculoskeletal: Negative for back pain and gait problem.  Skin: Negative for rash.  Neurological: Negative for light-headedness and headaches.  Psychiatric/Behavioral: Negative for agitation and behavioral problems.  All other systems reviewed and are negative.   Per  HPI unless specifically indicated above      Objective:    BP 114/80   Pulse 90   Temp 97.1 F (36.2 C) (Oral)   Ht 5\' 3"  (1.6 m)   Wt 134 lb (60.8 kg)   BMI 23.74 kg/m   Wt Readings from Last 3 Encounters:  02/12/16 134 lb (60.8 kg)  02/04/16 136 lb (61.7 kg)  02/02/16 136 lb 3.2 oz (61.8 kg)    Physical Exam  Constitutional: She is oriented to person, place, and time. She appears well-developed and well-nourished. No distress.  HENT:  Right Ear: Tympanic membrane, external ear and ear canal normal.  Left Ear: Tympanic membrane, external ear and ear canal normal.  Nose: Mucosal edema and rhinorrhea present. No epistaxis. Right sinus exhibits no maxillary sinus tenderness and no frontal sinus tenderness. Left sinus exhibits no maxillary sinus tenderness and no frontal sinus tenderness.  Mouth/Throat: Uvula is midline and mucous membranes are normal. Posterior oropharyngeal edema and posterior oropharyngeal erythema present. No oropharyngeal exudate or tonsillar abscesses.  Eyes: Conjunctivae and EOM are normal.  Cardiovascular: Normal rate, regular rhythm, normal heart sounds and intact distal pulses.   No murmur heard. Pulmonary/Chest: Effort normal and breath sounds normal. No respiratory distress. She has no wheezes.  Musculoskeletal: Normal range of motion. She exhibits no edema or tenderness.  Neurological: She is alert and oriented to person, place, and time. Coordination normal.  Skin: Skin is warm and dry. No rash noted. She is not diaphoretic.  Psychiatric: She has a normal mood and affect. Her  behavior is normal.  Vitals reviewed.  Chest x-ray: Left lower lobe consolidation, await final report from radiologist.    Assessment & Plan:   Problem List Items Addressed This Visit    None    Visit Diagnoses    Community acquired pneumonia of left lower lobe of lung (HCC)    -  Primary   Relevant Medications   cefTRIAXone (ROCEPHIN) injection 1 g (Completed)    albuterol (PROVENTIL HFA;VENTOLIN HFA) 108 (90 Base) MCG/ACT inhaler   Other Relevant Orders   DG Chest 2 View (Completed)       Follow up plan: Return if symptoms worsen or fail to improve.  Counseling provided for all of the vaccine components Orders Placed This Encounter  Procedures  . DG Chest 2 View    Arville Care, MD [facility] 02/12/2016, 8:39 AM

## 2016-02-20 ENCOUNTER — Ambulatory Visit (INDEPENDENT_AMBULATORY_CARE_PROVIDER_SITE_OTHER): Payer: BC Managed Care – PPO

## 2016-02-20 ENCOUNTER — Ambulatory Visit (INDEPENDENT_AMBULATORY_CARE_PROVIDER_SITE_OTHER): Payer: BC Managed Care – PPO | Admitting: Physician Assistant

## 2016-02-20 ENCOUNTER — Encounter: Payer: Self-pay | Admitting: Physician Assistant

## 2016-02-20 VITALS — BP 110/71 | HR 82 | Temp 98.3°F | Ht 63.0 in | Wt 134.0 lb

## 2016-02-20 DIAGNOSIS — J181 Lobar pneumonia, unspecified organism: Secondary | ICD-10-CM | POA: Diagnosis not present

## 2016-02-20 DIAGNOSIS — R05 Cough: Secondary | ICD-10-CM

## 2016-02-20 DIAGNOSIS — R059 Cough, unspecified: Secondary | ICD-10-CM

## 2016-02-20 DIAGNOSIS — J189 Pneumonia, unspecified organism: Secondary | ICD-10-CM

## 2016-02-20 MED ORDER — CEFTRIAXONE SODIUM 1 G IJ SOLR
1.0000 g | Freq: Once | INTRAMUSCULAR | Status: AC
  Administered 2016-02-20: 1 g via INTRAMUSCULAR

## 2016-02-20 NOTE — Progress Notes (Signed)
BP 110/71   Pulse 82   Temp 98.3 F (36.8 C) (Oral)   Ht 5\' 3"  (1.6 m)   Wt 134 lb (60.8 kg)   BMI 23.74 kg/m    Subjective:    Patient ID: [name], female    DOB: 1960-06-27, 55 y.o.   MRN: [id number]  HPI: [name] is a 55 y.o. female presenting on 02/20/2016 for Flank Pain (left. DX with pneumonia 10/19) and Back Pain Had rocephin 1 gram last week and did get better and has returned to work. However today she can feel the same pain in the left lower lung field and has had a slight but of production. Denies any fever or chills. No NVD.  Chest xray does show resolving pneumonia, but not gone.  Discussed needing further treatment and recheck CXR 4 weeks.  Relevant past medical, surgical, family and social history reviewed and updated as indicated. Allergies and medications reviewed and updated.  Past Medical History:  Diagnosis Date  . Allergy   . Anxiety   . 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 CESAREAN SECTION    . INNER EAR SURGERY      Review of Systems  Constitutional: Positive for fatigue. Negative for activity change and fever.  HENT: Negative.   Eyes: Negative.   Respiratory: Positive for cough and chest tightness. Negative for shortness of breath, wheezing and stridor.   Cardiovascular: Negative.  Negative for chest pain.  Gastrointestinal: Negative.  Negative for abdominal pain.  Endocrine: Negative.   Genitourinary: Negative.  Negative for dysuria.  Musculoskeletal: Negative.   Skin: Negative.   Neurological: Negative.       Medication List       Accurate as of 02/20/16  5:25 PM. Always use your most recent med list.          albuterol 108 (90 Base) MCG/ACT inhaler Commonly known as:  PROVENTIL HFA;VENTOLIN HFA Inhale 2 puffs into the lungs every 6 (six) hours as needed for wheezing or shortness of breath.   ALPRAZolam 0.5 MG tablet Commonly known as:  XANAX Take 0.5 mg by mouth at  bedtime as needed for sleep.   buPROPion 150 MG 12 hr tablet Commonly known as:  WELLBUTRIN SR Take 150 mg by mouth daily.   nitroGLYCERIN 0.4 MG SL tablet Commonly known as:  NITROSTAT Place 1 tablet (0.4 mg total) under the tongue every 5 (five) minutes as needed for chest pain.   PREMARIN vaginal cream Generic drug:  conjugated estrogens Reported on 05/08/2015   TRINTELLIX 20 MG Tabs Generic drug:  vortioxetine HBr          Objective:    BP 110/71   Pulse 82   Temp 98.3 F (36.8 C) (Oral)   Ht 5\' 3"  (1.6 m)   Wt 134 lb (60.8 kg)   BMI 23.74 kg/m   Allergies  Allergen Reactions  . Bactrim [Sulfamethoxazole-Trimethoprim]   . Hydrocodone Hives    Physical Exam  Constitutional: She is oriented to person, place, and time. She appears well-developed and well-nourished.  HENT:  Head: Normocephalic and atraumatic.  Eyes: Conjunctivae and EOM are normal. Pupils are equal, round, and reactive to light.  Neck: Normal range of motion. Neck supple.  Cardiovascular: Normal rate, regular rhythm, normal heart sounds and intact distal pulses.   Pulmonary/Chest: Effort normal and breath sounds normal. She has no wheezes. She has no rales. She exhibits no tenderness.  Abdominal:  Soft. Bowel sounds are normal.  Neurological: She is alert and oriented to person, place, and time. She has normal reflexes.  Skin: Skin is warm and dry. No rash noted.  Psychiatric: She has a normal mood and affect. Her behavior is normal. Judgment and thought content normal.    Results for orders placed or performed in visit on 02/02/16  Veritor Flu A/B Waived  Result Value Ref Range   Influenza A Negative Negative   Influenza B Negative Negative      Assessment & Plan:   1. Cough - DG Chest 2 View; Future  2. Community acquired pneumonia of left lower lobe of lung (HCC) Rocephin 1 g IM today Repeat 02/23/16 and 02/25/16 Repeat CXR 4 weeks  Continue all other maintenance medications as  listed above.  Follow up plan: Repeat CXR 4 weeks.  Orders Placed This Encounter  Procedures  . DG Chest 2 View    Educational handout given for pneumonia  Remus Loffler PA-C [facility] [address]  [place], [place] [address] [phone]   02/20/2016, 5:25 PM

## 2016-02-20 NOTE — Patient Instructions (Signed)

## 2016-02-23 ENCOUNTER — Ambulatory Visit (INDEPENDENT_AMBULATORY_CARE_PROVIDER_SITE_OTHER): Payer: BC Managed Care – PPO | Admitting: *Deleted

## 2016-02-23 DIAGNOSIS — J189 Pneumonia, unspecified organism: Secondary | ICD-10-CM

## 2016-02-23 MED ORDER — CEFTRIAXONE SODIUM 1 G IJ SOLR
1.0000 g | Freq: Once | INTRAMUSCULAR | 0 refills | Status: DC
Start: 2016-02-23 — End: 2016-02-23

## 2016-02-23 MED ORDER — CEFTRIAXONE SODIUM 1 G IJ SOLR
1.0000 g | Freq: Once | INTRAMUSCULAR | Status: AC
  Administered 2016-02-23: 1 g via INTRAMUSCULAR

## 2016-02-23 NOTE — Progress Notes (Signed)
Rocephin 1gm given L gluteal Pt tolerated well

## 2016-02-25 ENCOUNTER — Ambulatory Visit (INDEPENDENT_AMBULATORY_CARE_PROVIDER_SITE_OTHER): Payer: BC Managed Care – PPO | Admitting: *Deleted

## 2016-02-25 DIAGNOSIS — J188 Other pneumonia, unspecified organism: Secondary | ICD-10-CM | POA: Diagnosis not present

## 2016-02-25 MED ORDER — CEFTRIAXONE SODIUM 1 G IJ SOLR
1.0000 g | Freq: Once | INTRAMUSCULAR | Status: AC
  Administered 2016-02-25: 1 g via INTRAMUSCULAR

## 2016-02-25 NOTE — Progress Notes (Signed)
Pt given rocephin 1 gm Tolerated well

## 2016-03-23 ENCOUNTER — Encounter: Payer: Self-pay | Admitting: Physician Assistant

## 2016-03-23 ENCOUNTER — Ambulatory Visit (INDEPENDENT_AMBULATORY_CARE_PROVIDER_SITE_OTHER): Payer: BC Managed Care – PPO

## 2016-03-23 ENCOUNTER — Ambulatory Visit (INDEPENDENT_AMBULATORY_CARE_PROVIDER_SITE_OTHER): Payer: BC Managed Care – PPO | Admitting: Physician Assistant

## 2016-03-23 VITALS — BP 112/81 | HR 80 | Temp 98.6°F | Ht 63.0 in | Wt 136.0 lb

## 2016-03-23 DIAGNOSIS — J189 Pneumonia, unspecified organism: Secondary | ICD-10-CM

## 2016-03-23 NOTE — Progress Notes (Signed)
Cardiology Office Note   Date:  03/24/2016   ID:  [name], DOB 10-22-1960, MRN [id number]  PCP:  Bennie Pierini, FNP  Cardiologist:   Rollene Rotunda, MD  Referring:  Bennie Pierini, FNP  Chief Complaint  Patient presents with  . Coronary Artery Disease     History of Present Illness: [name] is a 55 y.o. female who presents for evaluation of chest pain. She's had no past cardiac history. There is a family history of later onset heart disease. One month ago she had some pain in her chest throat and back. This lasted for about 10 minutes. It came on at rest. She did not have associated symptoms such as nausea vomiting or diaphoresis. She's not had palpitations, presyncope or syncope. She's not had any new shortness of breath, PND or orthopnea. 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  Past Medical History:  Diagnosis Date  . Allergy   . Anxiety   . McArdle's disease [facility])     Past Surgical History:  Procedure Laterality Date  . CESAREAN SECTION    . INNER EAR SURGERY       Current Outpatient Prescriptions  Medication Sig Dispense Refill  . albuterol (PROVENTIL HFA;VENTOLIN HFA) 108 (90 Base) MCG/ACT inhaler Inhale 2 puffs into the lungs every 6 (six) hours as needed for wheezing or shortness of breath. 1 Inhaler 0  . ALPRAZolam (XANAX) 0.5 MG tablet Take 0.5 mg by mouth at bedtime as needed for sleep.    Marland Kitchen buPROPion (WELLBUTRIN SR) 150 MG 12 hr tablet Take 150 mg by mouth daily.    . nitroGLYCERIN (NITROSTAT) 0.4 MG SL tablet Place 1 tablet (0.4 mg total) under the tongue every 5 (five) minutes as needed for chest pain. 30 tablet 0  . PREMARIN vaginal cream Reported on 05/08/2015    . TRINTELLIX 20 MG TABS daily.      No current facility-administered medications for this visit.     Allergies:   Bactrim [sulfamethoxazole-trimethoprim] and Hydrocodone    Social History:  The patient   reports that she quit smoking about 23 years ago. She has never used smokeless tobacco. She reports that she drinks alcohol. She reports that she does not use drugs.   Family History:  The patient's family history includes Cirrhosis in her brother and father; Depression in her brother and sister; Diabetes in her father and mother; Heart disease (age of onset: 35) in her mother.    ROS:  Please see the history of present illness.   Otherwise, review of systems are positive for Recent lingering pneumonia.   All other systems are reviewed and negative.    PHYSICAL EXAM: VS:  BP 124/84   Pulse 90   Ht 5\' 3"  (1.6 m)   Wt 136 lb (61.7 kg)   SpO2 98%   BMI 24.09 kg/m  , BMI Body mass index is 24.09 kg/m. GENERAL:  Well appearing HEENT:  Pupils equal round and reactive, fundi not visualized, oral mucosa unremarkable NECK:  No jugular venous distention, waveform within normal limits, carotid upstroke brisk and symmetric, no bruits, no thyromegaly LYMPHATICS:  No cervical, inguinal adenopathy LUNGS:  Clear to auscultation bilaterally BACK:  No CVA tenderness CHEST:  Left mid crackles, no wheezing HEART:  PMI not displaced or sustained,S1 and S2 within normal limits, no S3, no S4, no clicks, no rubs, no murmurs ABD:  Flat, positive bowel sounds normal in frequency in  pitch, no bruits, no rebound, no guarding, no midline pulsatile mass, no hepatomegaly, no splenomegaly EXT:  2 plus pulses throughout, no edema, no cyanosis no clubbing SKIN:  No rashes no nodules NEURO:  Cranial nerves II through XII grossly intact, motor grossly intact throughout PSYCH:  Cognitively intact, oriented to person place and time    EKG:  EKG is not ordered today. The ekg ordered 9/21/17demonstrates NSR, rate 72, RSR prime V1 and V2, no acute ST-T wave changes.   Recent Labs: 01/15/2016: ALT 21; BUN 16; Creatinine, Ser 0.88; Potassium 5.1; Sodium 142    Lipid Panel    Component Value Date/Time   CHOL 163  01/15/2016 1112   TRIG 111 01/15/2016 1112   HDL 63 01/15/2016 1112   CHOLHDL 2.6 01/15/2016 1112   LDLCALC 78 01/15/2016 1112      Wt Readings from Last 3 Encounters:  03/24/16 136 lb (61.7 kg)  03/23/16 136 lb (61.7 kg)  02/20/16 134 lb (60.8 kg)      Other studies Reviewed: Additional studies/ records that were reviewed today include: Office EKG. Review of the above records demonstrates:  Please see elsewhere in the note.     ASSESSMENT AND PLAN:  CHEST PAIN:  The patient does have some cardiovascular risk factors and her pain was somewhat worrisome. I would like to perform a POET (Plain Old Exercise Treadmill).  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 Therefore, she will have a [employer].  RISK REDUCTION:  Her blood pressure is controlled. I would avoid statins in the future with McArdle syndrome. Further risk reduction will be based on results of the stress test.   Current medicines are reviewed at length with the patient today.  The patient does not have concerns regarding medicines.  The following changes have been made:  no change  Labs/ tests ordered today include:   Orders Placed This Encounter  Procedures  . Myocardial Perfusion Imaging     Disposition:   FU with me as needed.      Signed, Rollene Rotunda, MD  03/24/2016 7:45 PM    [facility]

## 2016-03-23 NOTE — Progress Notes (Signed)
BP 112/81   Pulse 80   Temp 98.6 F (37 C) (Oral)   Ht 5\' 3"  (1.6 m)   Wt 136 lb (61.7 kg)   BMI 24.09 kg/m    Subjective:    Patient ID: [name], female    DOB: 27-Mar-1961, 55 y.o.   MRN: [id number]  HPI: Kaeliana Pinos is a 55 y.o. female presenting on 03/23/2016 for Follow-up (pneumonia ) Mild coughing at time, no production. Denies whee243zing, fever or chills. Has distant smoking history   Past Medical History:  Diagnosis Date  . Allergy   . Anxiety   . McArdle's disease (HCC)    Relevant past medical, surgical, family and social history reviewed and updated as indicated. Interim medical history since our last visit reviewed. Allergies and medications reviewed and updated. DATA REVIEWED: CHART IN EPIC  Social History   Social History  . Marital status: Married    Spouse name: N/A  . Number of children: N/A  . Years of education: N/A   Occupational History  . Not on file.   Social History Main Topics  . Smoking status: Former Smoker    Quit date: 04/26/1992  . Smokeless tobacco: Never Used  . Alcohol use Yes     Comment: occ  . Drug use: No  . Sexual activity: Not on file   Other Topics Concern  . Not on file   Social History Narrative  . No narrative on file    Past Surgical History:  Procedure Laterality Date  . CESAREAN SECTION    . INNER EAR SURGERY      Family History  Problem Relation Age of Onset  . Diabetes Mother   . Heart disease Mother   . Diabetes Father   . Cirrhosis Father   . Depression Sister   . Depression Brother   . Cirrhosis Brother     Review of Systems  Constitutional: Negative.   HENT: Negative.   Eyes: Negative.   Respiratory: Negative.   Gastrointestinal: Negative.   Genitourinary: Negative.       Medication List       Accurate as of 03/23/16  9:18 AM. Always use your most recent med list.          albuterol 108 (90 Base) MCG/ACT inhaler Commonly known as:  PROVENTIL HFA;VENTOLIN  HFA Inhale 2 puffs into the lungs every 6 (six) hours as needed for wheezing or shortness of breath.   ALPRAZolam 0.5 MG tablet Commonly known as:  XANAX Take 0.5 mg by mouth at bedtime as needed for sleep.   buPROPion 150 MG 12 hr tablet Commonly known as:  WELLBUTRIN SR Take 150 mg by mouth daily.   nitroGLYCERIN 0.4 MG SL tablet Commonly known as:  NITROSTAT Place 1 tablet (0.4 mg total) under the tongue every 5 (five) minutes as needed for chest pain.   PREMARIN vaginal cream Generic drug:  conjugated estrogens Reported on 05/08/2015   TRINTELLIX 20 MG Tabs Generic drug:  vortioxetine HBr          Objective:    BP 112/81   Pulse 80   Temp 98.6 F (37 C) (Oral)   Ht 5\' 3"  (1.6 m)   Wt 136 lb (61.7 kg)   BMI 24.09 kg/m   Allergies  Allergen Reactions  . Bactrim [Sulfamethoxazole-Trimethoprim]   . Hydrocodone Hives    Wt Readings from Last 3 Encounters:  03/23/16 136 lb (61.7 kg)  02/20/16 134 lb (60.8 kg)  02/12/16 134  lb (60.8 kg)    Physical Exam  Constitutional: She is oriented to person, place, and time. She appears well-developed and well-nourished.  HENT:  Head: Normocephalic and atraumatic.  Cardiovascular: Normal rate, regular rhythm, normal heart sounds and intact distal pulses.   Pulmonary/Chest: Effort normal and breath sounds normal.  Neurological: She is alert and oriented to person, place, and time. She has normal reflexes.  Skin: Skin is warm and dry. No rash noted.  Psychiatric: She has a normal mood and affect. Her behavior is normal. Judgment and thought content normal.        Assessment & Plan:   1. Pneumonia due to infectious organism, unspecified laterality, unspecified part of lung Resoling left lingular opacity, improved from previous view. Recommend recheck 3-4 weeks to assure resolution. Patient understands. Future order placed. - DG Chest 2 View; Future - DG Chest 2 View; Future  Continue all other maintenance medications  as listed above.  Follow up plan: Follow-up as needed or worsening of symptoms. Call office for any issues.   Orders Placed This Encounter  Procedures  . DG Chest 2 View  . DG Chest 2 View    Educational handout given for pneumonia  Remus Loffler PA-C [facility] [address]  [place], [place] [address] [phone]   03/23/2016, 9:18 AM

## 2016-03-23 NOTE — Patient Instructions (Signed)

## 2016-03-24 ENCOUNTER — Ambulatory Visit (INDEPENDENT_AMBULATORY_CARE_PROVIDER_SITE_OTHER): Payer: BC Managed Care – PPO | Admitting: Cardiology

## 2016-03-24 ENCOUNTER — Encounter: Payer: Self-pay | Admitting: Cardiology

## 2016-03-24 VITALS — BP 124/84 | HR 90 | Ht 63.0 in | Wt 136.0 lb

## 2016-03-24 DIAGNOSIS — R079 Chest pain, unspecified: Secondary | ICD-10-CM

## 2016-03-24 NOTE — Patient Instructions (Signed)
Medication Instructions:  The current medical regimen is effective;  continue present plan and medications.  Testing/Procedures: Your physician has requested that you have a lexiscan myoview. For further information please visit www.[email or web address]. Please follow instruction sheet, as given.  Follow-Up: Follow up as needed after testing.  Thank you for choosing [facility]!!     

## 2016-03-26 ENCOUNTER — Telehealth (HOSPITAL_COMMUNITY): Payer: Self-pay | Admitting: Radiology

## 2016-03-26 NOTE — Telephone Encounter (Signed)
Patient given detailed instructions per Myocardial Perfusion Study Information Sheet for the test on 04/05/2016 at 10:00. Patient notified to arrive 15 minutes early and that it is imperative to arrive on time for appointment to keep from having the test rescheduled.  If you need to cancel or reschedule your appointment, please call the office within 24 hours of your appointment. Failure to do so may result in a cancellation of your appointment, and a $50 no show fee. Patient verbalized understanding.EHK

## 2016-03-31 ENCOUNTER — Telehealth (HOSPITAL_COMMUNITY): Payer: Self-pay | Admitting: *Deleted

## 2016-03-31 NOTE — Telephone Encounter (Signed)
Left message on voicemail per DPR in reference to upcoming appointment scheduled on 04/05/16 with detailed instructions given per Myocardial Perfusion Study Information Sheet for the test. LM to arrive 15 minutes early, and that it is imperative to arrive on time for appointment to keep from having the test rescheduled. If you need to cancel or reschedule your appointment, please call the office within 24 hours of your appointment. Failure to do so may result in a cancellation of your appointment, and a $50 no show fee. Phone number given for call back for any questions. Ricky AlaSmith, Jannelle Notaro Jacqueline

## 2016-04-05 ENCOUNTER — Ambulatory Visit (HOSPITAL_COMMUNITY): Payer: BC Managed Care – PPO | Attending: Cardiology

## 2016-04-05 DIAGNOSIS — R079 Chest pain, unspecified: Secondary | ICD-10-CM | POA: Diagnosis not present

## 2016-04-05 LAB — MYOCARDIAL PERFUSION IMAGING
CHL CUP NUCLEAR SRS: 14
CHL CUP RESTING HR STRESS: 78 {beats}/min
CSEPPHR: 117 {beats}/min
LV sys vol: 11 mL
LVDIAVOL: 47 mL (ref 46–106)
RATE: 0.36
SDS: 4
SSS: 18
TID: 1.11

## 2016-04-05 MED ORDER — TECHNETIUM TC 99M TETROFOSMIN IV KIT
31.7000 | PACK | Freq: Once | INTRAVENOUS | Status: AC | PRN
  Administered 2016-04-05: 31.7 via INTRAVENOUS
  Filled 2016-04-05: qty 32

## 2016-04-05 MED ORDER — REGADENOSON 0.4 MG/5ML IV SOLN
0.4000 mg | Freq: Once | INTRAVENOUS | Status: AC
  Administered 2016-04-05: 0.4 mg via INTRAVENOUS

## 2016-04-05 MED ORDER — TECHNETIUM TC 99M TETROFOSMIN IV KIT
10.2000 | PACK | Freq: Once | INTRAVENOUS | Status: AC | PRN
  Administered 2016-04-05: 10.2 via INTRAVENOUS
  Filled 2016-04-05: qty 11

## 2016-04-12 ENCOUNTER — Ambulatory Visit (INDEPENDENT_AMBULATORY_CARE_PROVIDER_SITE_OTHER): Payer: BC Managed Care – PPO

## 2016-04-12 ENCOUNTER — Other Ambulatory Visit: Payer: Self-pay | Admitting: Physician Assistant

## 2016-04-12 ENCOUNTER — Ambulatory Visit: Payer: BC Managed Care – PPO

## 2016-04-12 DIAGNOSIS — Z09 Encounter for follow-up examination after completed treatment for conditions other than malignant neoplasm: Secondary | ICD-10-CM | POA: Diagnosis not present

## 2016-04-12 DIAGNOSIS — J189 Pneumonia, unspecified organism: Secondary | ICD-10-CM

## 2016-04-13 ENCOUNTER — Telehealth: Payer: Self-pay | Admitting: Physician Assistant

## 2016-04-13 NOTE — Telephone Encounter (Signed)
Patient aware of results.

## 2016-06-21 ENCOUNTER — Ambulatory Visit: Payer: BC Managed Care – PPO | Admitting: Psychology

## 2016-06-28 ENCOUNTER — Ambulatory Visit (INDEPENDENT_AMBULATORY_CARE_PROVIDER_SITE_OTHER): Payer: BC Managed Care – PPO | Admitting: Psychology

## 2016-06-28 DIAGNOSIS — F331 Major depressive disorder, recurrent, moderate: Secondary | ICD-10-CM | POA: Diagnosis not present

## 2016-08-09 ENCOUNTER — Ambulatory Visit (INDEPENDENT_AMBULATORY_CARE_PROVIDER_SITE_OTHER): Payer: BC Managed Care – PPO | Admitting: Psychology

## 2016-08-09 DIAGNOSIS — F331 Major depressive disorder, recurrent, moderate: Secondary | ICD-10-CM

## 2016-09-06 ENCOUNTER — Ambulatory Visit (INDEPENDENT_AMBULATORY_CARE_PROVIDER_SITE_OTHER): Payer: BC Managed Care – PPO | Admitting: Psychology

## 2016-09-06 DIAGNOSIS — F331 Major depressive disorder, recurrent, moderate: Secondary | ICD-10-CM | POA: Diagnosis not present

## 2016-11-01 ENCOUNTER — Ambulatory Visit (INDEPENDENT_AMBULATORY_CARE_PROVIDER_SITE_OTHER): Payer: BC Managed Care – PPO | Admitting: Psychology

## 2016-11-01 DIAGNOSIS — F331 Major depressive disorder, recurrent, moderate: Secondary | ICD-10-CM

## 2016-11-30 ENCOUNTER — Other Ambulatory Visit: Payer: Self-pay | Admitting: Obstetrics and Gynecology

## 2016-11-30 DIAGNOSIS — Z1231 Encounter for screening mammogram for malignant neoplasm of breast: Secondary | ICD-10-CM

## 2016-12-06 ENCOUNTER — Ambulatory Visit
Admission: RE | Admit: 2016-12-06 | Discharge: 2016-12-06 | Disposition: A | Discharge status: Home / Self Care | Payer: BC Managed Care – PPO | Source: Ambulatory Visit | Attending: Obstetrics and Gynecology | Admitting: Obstetrics and Gynecology

## 2016-12-06 DIAGNOSIS — Z1231 Encounter for screening mammogram for malignant neoplasm of breast: Secondary | ICD-10-CM

## 2017-01-10 ENCOUNTER — Ambulatory Visit (INDEPENDENT_AMBULATORY_CARE_PROVIDER_SITE_OTHER): Payer: BC Managed Care – PPO | Admitting: Psychology

## 2017-01-10 DIAGNOSIS — F331 Major depressive disorder, recurrent, moderate: Secondary | ICD-10-CM | POA: Diagnosis not present

## 2017-01-26 ENCOUNTER — Encounter: Payer: Self-pay | Admitting: Family Medicine

## 2017-01-26 ENCOUNTER — Ambulatory Visit (INDEPENDENT_AMBULATORY_CARE_PROVIDER_SITE_OTHER): Payer: BC Managed Care – PPO | Admitting: Family Medicine

## 2017-01-26 VITALS — BP 109/74 | HR 74 | Temp 97.7°F | Ht 63.0 in | Wt 148.0 lb

## 2017-01-26 DIAGNOSIS — Z0000 Encounter for general adult medical examination without abnormal findings: Secondary | ICD-10-CM

## 2017-01-26 NOTE — Progress Notes (Signed)
 BP 109/74   Pulse 74   Temp 97.7 F (36.5 C) (Oral)   Ht 5' 3" (1.6 m)   Wt 148 lb (67.1 kg)   BMI 26.22 kg/m    Subjective:    Patient ID: [name], female    DOB: 07/08/1960, 56 y.o.   MRN: [id number]  HPI: [name] is a 56 y.o. female presenting on 01/26/2017 for Labwork (patient is fasting; normally has labs once per year) and Back Pain (after moving this past weekend; lower back)   HPI Adult well exam Patient is coming in today for adult well exam and fasting labs. She has some lumbar pain from moving recently and doing a lot of boxes but that is improving with anti-inflammatories. Otherwise she denies any major issues. Patient denies any chest pain, shortness of breath, headaches or vision issues, abdominal complaints, diarrhea, nausea, vomiting, or joint issues. She normally gets her Pap smears and will get it at the end of the year. She has had a colonoscopy but does not know when she needs to go back and will call them. She gets her mammogram every year.  Relevant past medical, surgical, family and social history reviewed and updated as indicated. Interim medical history since our last visit reviewed. Allergies and medications reviewed and updated.  Review of Systems  Constitutional: Negative for chills and fever.  HENT: Negative for congestion, ear discharge, ear pain and tinnitus.   Eyes: Negative for pain, redness and visual disturbance.  Respiratory: Negative for cough, chest tightness, shortness of breath and wheezing.   Cardiovascular: Negative for chest pain, palpitations and leg swelling.  Gastrointestinal: Negative for abdominal pain, blood in stool, constipation and diarrhea.  Genitourinary: Negative for difficulty urinating, dysuria and hematuria.  Musculoskeletal: Positive for back pain. Negative for gait problem and myalgias.  Skin: Negative for rash.  Neurological: Negative for dizziness, weakness, light-headedness and headaches.    Psychiatric/Behavioral: Negative for agitation, behavioral problems and suicidal ideas.  All other systems reviewed and are negative.   Per HPI unless specifically indicated above        Objective:    BP 109/74   Pulse 74   Temp 97.7 F (36.5 C) (Oral)   Ht 5' 3" (1.6 m)   Wt 148 lb (67.1 kg)   BMI 26.22 kg/m   Wt Readings from Last 3 Encounters:  01/26/17 148 lb (67.1 kg)  04/05/16 136 lb (61.7 kg)  03/24/16 136 lb (61.7 kg)    Physical Exam  Constitutional: She is oriented to person, place, and time. She appears well-developed and well-nourished. No distress.  HENT:  Right Ear: External ear normal.  Left Ear: External ear normal.  Nose: Nose normal.  Mouth/Throat: Oropharynx is clear and moist. No oropharyngeal exudate.  Eyes: Conjunctivae are normal.  Neck: Neck supple. No thyromegaly present.  Cardiovascular: Normal rate, regular rhythm, normal heart sounds and intact distal pulses.   No murmur heard. Pulmonary/Chest: Effort normal and breath sounds normal. No respiratory distress. She has no wheezes. She has no rales.  Abdominal: Soft. Bowel sounds are normal. She exhibits no distension. There is no tenderness. There is no rebound.  Musculoskeletal: Normal range of motion. She exhibits tenderness (Bilateral lumbar tenderness in the bandlike area. Negative straight leg raise bilaterally). She exhibits no edema.  Lymphadenopathy:    She has no cervical adenopathy.  Neurological: She is alert and oriented to person, place, and time. Coordination normal.  Skin: Skin is warm and dry. No rash noted. She    BP 109/74   Pulse 74   Temp 97.7 F (36.5 C) (Oral)   Ht 5' 3" (1.6 m)   Wt 148 lb (67.1 kg)   BMI 26.22 kg/m    Subjective:    Patient ID: [name], female    DOB: 07/08/1960, 56 y.o.   MRN: [id number]  HPI: [name] is a 56 y.o. female presenting on 01/26/2017 for Labwork (patient is fasting; normally has labs once per year) and Back Pain (after moving this past weekend; lower back)   HPI Adult well exam Patient is coming in today for adult well exam and fasting labs. She has some lumbar pain from moving recently and doing a lot of boxes but that is improving with anti-inflammatories. Otherwise she denies any major issues. Patient denies any chest pain, shortness of breath, headaches or vision issues, abdominal complaints, diarrhea, nausea, vomiting, or joint issues. She normally gets her Pap smears and will get it at the end of the year. She has had a colonoscopy but does not know when she needs to go back and will call them. She gets her mammogram every year.  Relevant past medical, surgical, family and social history reviewed and updated as indicated. Interim medical history since our last visit reviewed. Allergies and medications reviewed and updated.  Review of Systems  Constitutional: Negative for chills and fever.  HENT: Negative for congestion, ear discharge, ear pain and tinnitus.   Eyes: Negative for pain, redness and visual disturbance.  Respiratory: Negative for cough, chest tightness, shortness of breath and wheezing.   Cardiovascular: Negative for chest pain, palpitations and leg swelling.  Gastrointestinal: Negative for abdominal pain, blood in stool, constipation and diarrhea.  Genitourinary: Negative for difficulty urinating, dysuria and hematuria.  Musculoskeletal: Positive for back pain. Negative for gait problem and myalgias.  Skin: Negative for rash.  Neurological: Negative for dizziness, weakness, light-headedness and headaches.    Psychiatric/Behavioral: Negative for agitation, behavioral problems and suicidal ideas.  All other systems reviewed and are negative.   Per HPI unless specifically indicated above        Objective:    BP 109/74   Pulse 74   Temp 97.7 F (36.5 C) (Oral)   Ht 5' 3" (1.6 m)   Wt 148 lb (67.1 kg)   BMI 26.22 kg/m   Wt Readings from Last 3 Encounters:  01/26/17 148 lb (67.1 kg)  04/05/16 136 lb (61.7 kg)  03/24/16 136 lb (61.7 kg)    Physical Exam  Constitutional: She is oriented to person, place, and time. She appears well-developed and well-nourished. No distress.  HENT:  Right Ear: External ear normal.  Left Ear: External ear normal.  Nose: Nose normal.  Mouth/Throat: Oropharynx is clear and moist. No oropharyngeal exudate.  Eyes: Conjunctivae are normal.  Neck: Neck supple. No thyromegaly present.  Cardiovascular: Normal rate, regular rhythm, normal heart sounds and intact distal pulses.   No murmur heard. Pulmonary/Chest: Effort normal and breath sounds normal. No respiratory distress. She has no wheezes. She has no rales.  Abdominal: Soft. Bowel sounds are normal. She exhibits no distension. There is no tenderness. There is no rebound.  Musculoskeletal: Normal range of motion. She exhibits tenderness (Bilateral lumbar tenderness in the bandlike area. Negative straight leg raise bilaterally). She exhibits no edema.  Lymphadenopathy:    She has no cervical adenopathy.  Neurological: She is alert and oriented to person, place, and time. Coordination normal.  Skin: Skin is warm and dry. No rash noted. She

## 2017-01-27 ENCOUNTER — Other Ambulatory Visit: Payer: Self-pay

## 2017-01-27 DIAGNOSIS — R7989 Other specified abnormal findings of blood chemistry: Secondary | ICD-10-CM

## 2017-01-27 DIAGNOSIS — R945 Abnormal results of liver function studies: Principal | ICD-10-CM

## 2017-01-27 LAB — LIPID PANEL
CHOLESTEROL TOTAL: 156 mg/dL (ref 100–199)
Chol/HDL Ratio: 2.6 ratio (ref 0.0–4.4)
HDL: 60 mg/dL (ref >39)
LDL CALC: 74 mg/dL (ref 0–99)
TRIGLYCERIDES: 110 mg/dL (ref 0–149)
VLDL CHOLESTEROL CAL: 22 mg/dL (ref 5–40)

## 2017-01-27 LAB — CBC WITH DIFFERENTIAL/PLATELET
BASOS: 1 %
Basophils Absolute: 0 10*3/uL (ref 0.0–0.2)
EOS (ABSOLUTE): 0.2 10*3/uL (ref 0.0–0.4)
EOS: 4 %
HEMATOCRIT: 42.5 % (ref 34.0–46.6)
HEMOGLOBIN: 14 g/dL (ref 11.1–15.9)
Immature Grans (Abs): 0 10*3/uL (ref 0.0–0.1)
Immature Granulocytes: 0 %
LYMPHS ABS: 1.7 10*3/uL (ref 0.7–3.1)
Lymphs: 34 %
MCH: 30.9 pg (ref 26.6–33.0)
MCHC: 32.9 g/dL (ref 31.5–35.7)
MCV: 94 fL (ref 79–97)
MONOCYTES: 13 %
MONOS ABS: 0.7 10*3/uL (ref 0.1–0.9)
Neutrophils Absolute: 2.3 10*3/uL (ref 1.4–7.0)
Neutrophils: 48 %
Platelets: 208 10*3/uL (ref 150–379)
RBC: 4.53 x10E6/uL (ref 3.77–5.28)
RDW: 13.4 % (ref 12.3–15.4)
WBC: 4.9 10*3/uL (ref 3.4–10.8)

## 2017-01-27 LAB — CMP14+EGFR
ALBUMIN: 4.3 g/dL (ref 3.5–5.5)
ALK PHOS: 65 IU/L (ref 39–117)
ALT: 49 IU/L — AB (ref 0–32)
AST: 84 IU/L — AB (ref 0–40)
Albumin/Globulin Ratio: 2.2 (ref 1.2–2.2)
BUN/Creatinine Ratio: 17 (ref 9–23)
BUN: 17 mg/dL (ref 6–24)
Bilirubin Total: 0.5 mg/dL (ref 0.0–1.2)
CHLORIDE: 105 mmol/L (ref 96–106)
CO2: 27 mmol/L (ref 20–29)
CREATININE: 1.03 mg/dL — AB (ref 0.57–1.00)
Calcium: 9.1 mg/dL (ref 8.7–10.2)
GFR calc Af Amer: 70 mL/min/{1.73_m2} (ref >59)
GFR calc non Af Amer: 60 mL/min/{1.73_m2} (ref >59)
GLUCOSE: 86 mg/dL (ref 65–99)
Globulin, Total: 2 g/dL (ref 1.5–4.5)
Potassium: 5.2 mmol/L (ref 3.5–5.2)
Sodium: 144 mmol/L (ref 134–144)
Total Protein: 6.3 g/dL (ref 6.0–8.5)

## 2017-02-21 ENCOUNTER — Ambulatory Visit (INDEPENDENT_AMBULATORY_CARE_PROVIDER_SITE_OTHER): Payer: BC Managed Care – PPO

## 2017-02-21 ENCOUNTER — Encounter: Payer: Self-pay | Admitting: Family

## 2017-02-21 ENCOUNTER — Ambulatory Visit (INDEPENDENT_AMBULATORY_CARE_PROVIDER_SITE_OTHER): Payer: BC Managed Care – PPO | Admitting: Family

## 2017-02-21 VITALS — BP 109/67 | HR 85 | Temp 97.6°F | Ht 63.0 in | Wt 151.2 lb

## 2017-02-21 DIAGNOSIS — J069 Acute upper respiratory infection, unspecified: Secondary | ICD-10-CM

## 2017-02-21 DIAGNOSIS — S161XXA Strain of muscle, fascia and tendon at neck level, initial encounter: Secondary | ICD-10-CM | POA: Diagnosis not present

## 2017-02-21 DIAGNOSIS — R0989 Other specified symptoms and signs involving the circulatory and respiratory systems: Secondary | ICD-10-CM

## 2017-02-21 MED ORDER — CYCLOBENZAPRINE HCL 5 MG PO TABS
5.0000 mg | ORAL_TABLET | Freq: Three times a day (TID) | ORAL | 0 refills | Status: DC | PRN
Start: 2017-02-21 — End: 2017-05-13

## 2017-02-21 MED ORDER — NAPROXEN 500 MG PO TABS: 500.0000 mg | ORAL_TABLET | Freq: Two times a day (BID) | ORAL | 0 refills | Status: DC

## 2017-02-21 NOTE — Progress Notes (Signed)
Subjective:    Patient ID: [name], female    DOB: 06-08-1960, 56 y.o.   MRN: [id number]  URI   This is a new problem. The current episode started in the past 7 days. The problem has been waxing and waning. There has been no fever. Associated symptoms include congestion, coughing, headaches, neck pain, rhinorrhea and swollen glands. Pertinent negatives include no ear pain, sinus pain or wheezing.  Neck Pain   This is a new problem. The current episode started in the past 7 days. The problem occurs constantly. The problem has been waxing and waning. Associated with: "coughing really hard the night before" The pain is present in the left side. The quality of the pain is described as aching. The pain is at a severity of 8/10. The pain is moderate. The symptoms are aggravated by twisting. Associated symptoms include headaches. Pertinent negatives include no leg pain, photophobia, visual change or weakness. She has tried NSAIDs for the symptoms. The treatment provided mild relief.      Review of Systems  HENT: Positive for congestion and rhinorrhea. Negative for ear pain and sinus pain.   Eyes: Negative for photophobia.  Respiratory: Positive for cough. Negative for wheezing.   Musculoskeletal: Positive for neck pain.  Neurological: Positive for headaches. Negative for weakness.  All other systems reviewed and are negative.      Objective:   Physical Exam  Constitutional: She is oriented to person, place, and time. She appears well-developed and well-nourished. No distress.  HENT:  Head: Normocephalic and atraumatic.  Right Ear: External ear normal.  Left Ear: External ear normal.  Nose: Mucosal edema and rhinorrhea present.  Mouth/Throat: Posterior oropharyngeal erythema present.  Eyes: Pupils are equal, round, and reactive to light.  Cardiovascular: Normal rate, regular rhythm, normal heart sounds and intact distal pulses.   No murmur heard. Pulmonary/Chest: Effort normal and  breath sounds normal. No respiratory distress. She has no wheezes.  Abdominal: Soft. Bowel sounds are normal. She exhibits no distension. There is no tenderness.  Musculoskeletal: Normal range of motion. She exhibits tenderness. She exhibits no edema.  Pain in left posterior neck with rotation  Neurological: She is alert and oriented to person, place, and time.  Skin: Skin is warm and dry.  Psychiatric: She has a normal mood and affect. Her behavior is normal. Judgment and thought content normal.  Vitals reviewed.  Chest x-ray- Negative Preliminary reading by Jannifer Rodney, FNP [facility]   BP 109/67 (BP Location: Left Arm, Patient Position: Sitting, Cuff Size: Normal)   Pulse 85   Temp 97.6 F (36.4 C) (Oral)      Assessment & Plan:  1. Chest congestion - DG Chest 2 View  2. Viral upper respiratory tract infection  3. Strain of neck muscle, initial encounter -Rest Ice and heat ROM exercises encouraged Sedation precautions discussed RTO prn   - cyclobenzaprine (FLEXERIL) 5 MG tablet; Take 1 tablet (5 mg total) by mouth 3 (three) times daily as needed for muscle spasms.  Dispense: 30 tablet; Refill: 0 - naproxen (NAPROSYN) 500 MG tablet; Take 1 tablet (500 mg total) by mouth 2 (two) times daily with a meal.  Dispense: 30 tablet; Refill: 0    Jannifer Rodney, FNP

## 2017-02-21 NOTE — Patient Instructions (Signed)
Cervical Sprain A cervical sprain is a stretch or tear in one or more of the tough, cord-like tissues that connect bones (ligaments) in the neck. Cervical sprains can range from mild to severe. Severe cervical sprains can cause the spinal bones (vertebrae) in the neck to be unstable. This can lead to spinal cord damage and can result in serious nervous system problems. The amount of time that it takes for a cervical sprain to get better depends on the cause and extent of the injury. Most cervical sprains heal in 4-6 weeks. What are the causes? Cervical sprains may be caused by an injury (trauma), such as from a motor vehicle accident, a fall, or sudden forward and backward whipping movement of the head and neck (whiplash injury). Mild cervical sprains may be caused by wear and tear over time, such as from poor posture, sitting in a chair that does not provide support, or looking up or down for long periods of time. What increases the risk? The following factors may make you more likely to develop this condition:  Participating in activities that have a high risk of trauma to the neck. These include contact sports, auto racing, gymnastics, and diving.  Taking risks when driving or riding in a motor vehicle, such as speeding.  Having osteoarthritis of the spine.  Having poor strength and flexibility of the neck.  A previous neck injury.  Having poor posture.  Spending a lot of time in certain positions that put stress on the neck, such as sitting at a computer for long periods of time. What are the signs or symptoms? Symptoms of this condition include:  Pain, soreness, stiffness, tenderness, swelling, or a burning sensation in the front, back, or sides of the neck.  Sudden tightening of neck muscles that you cannot control (muscle spasms).  Pain in the shoulders or upper back.  Limited ability to move the neck.  Headache.  Dizziness.  Nausea.  Vomiting.  Weakness, numbness, or  tingling in a hand or an arm. Symptoms may develop right away after injury, or they may develop over a few days. In some cases, symptoms may go away with treatment and return (recur) over time. How is this diagnosed? This condition may be diagnosed based on:  Your medical history.  Your symptoms.  Any recent injuries or known neck problems that you have, such as arthritis in the neck.  A physical exam.  Imaging tests, such as:  X-rays.  MRI.  CT scan. How is this treated? This condition is treated by resting and icing the injured area and doing physical therapy exercises. Depending on the severity of your condition, treatment may also include:  Keeping your neck in place (immobilized) for periods of time. This may be done using:  A cervical collar. This supports your chin and the back of your head.  A cervical traction device. This is a sling that holds up your head. This removes weight and pressure from your neck, and it may help to relieve pain.  Medicines that help to relieve pain and inflammation.  Medicines that help to relax your muscles (muscle relaxants).  Surgery. This is rare. Follow these instructions at home: If you have a cervical collar:   Wear it as told by your health care provider. Do not remove the collar unless instructed by your health care provider.  Ask your health care provider before you make any adjustments to your collar.  If you have long hair, keep it outside of the collar.    Ask your health care provider if you can remove the collar for cleaning and bathing. If you are allowed to remove the collar for cleaning or bathing:  Follow instructions from your health care provider about how to remove the collar safely.  Clean the collar by wiping it with mild soap and water and drying it completely.  If your collar has removable pads, remove them every 1-2 days and wash them by hand with soap and water. Let them air-dry completely before you put  them back in the collar.  Check your skin under the collar for irritation or sores. If you see any, tell your health care provider. Managing pain, stiffness, and swelling   If directed, use a cervical traction device as told by your health care provider.  If directed, apply heat to the affected area before you do your physical therapy or as often as told by your health care provider. Use the heat source that your health care provider recommends, such as a moist heat pack or a heating pad.  Place a towel between your skin and the heat source.  Leave the heat on for 20-30 minutes.  Remove the heat if your skin turns bright red. This is especially important if you are unable to feel pain, heat, or cold. You may have a greater risk of getting burned.  If directed, put ice on the affected area:  Put ice in a plastic bag.  Place a towel between your skin and the bag.  Leave the ice on for 20 minutes, 2-3 times a day. Activity   Do not drive while wearing a cervical collar. If you do not have a cervical collar, ask your health care provider if it is safe to drive while your neck heals.  Do not drive or use heavy machinery while taking prescription pain medicine or muscle relaxants, unless your health care provider approves.  Do not lift anything that is heavier than 10 lb (4.5 kg) until your health care provider tells you that it is safe.  Rest as directed by your health care provider. Avoid positions and activities that make your symptoms worse. Ask your health care provider what activities are safe for you.  If physical therapy was prescribed, do exercises as told by your health care provider or physical therapist. General instructions   Take over-the-counter and prescription medicines only as told by your health care provider.  Do not use any products that contain nicotine or tobacco, such as cigarettes and e-cigarettes. These can delay healing. If you need help quitting, ask your  health care provider.  Keep all follow-up visits as told by your health care provider or physical therapist. This is important. How is this prevented? To prevent a cervical sprain from happening again:  Use and maintain good posture. Make any needed adjustments to your workstation to help you use good posture.  Exercise regularly as directed by your health care provider or physical therapist.  Avoid risky activities that may cause a cervical sprain. Contact a health care provider if:  You have symptoms that get worse or do not get better after 2 weeks of treatment.  You have pain that gets worse or does not get better with medicine.  You develop new, unexplained symptoms.  You have sores or irritated skin on your neck from wearing your cervical collar. Get help right away if:  You have severe pain.  You develop numbness, tingling, or weakness in any part of your body.  You cannot move   a part of your body (you have paralysis).  You have neck pain along with:  Severe dizziness.  Headache. Summary  A cervical sprain is a stretch or tear in one or more of the tough, cord-like tissues that connect bones (ligaments) in the neck.  Cervical sprains may be caused by an injury (trauma), such as from a motor vehicle accident, a fall, or sudden forward and backward whipping movement of the head and neck (whiplash injury).  Symptoms may develop right away after injury, or they may develop over a few days.  This condition is treated by resting and icing the injured area and doing physical therapy exercises. This information is not intended to replace advice given to you by your health care provider. Make sure you discuss any questions you have with your health care provider. Document Released: 02/07/2007 Document Revised: 12/10/2015 Document Reviewed: 12/10/2015 Elsevier Interactive Patient Education  2017 [employer]  

## 2017-04-01 ENCOUNTER — Other Ambulatory Visit: Payer: BC Managed Care – PPO

## 2017-04-01 DIAGNOSIS — R7989 Other specified abnormal findings of blood chemistry: Secondary | ICD-10-CM

## 2017-04-01 DIAGNOSIS — R739 Hyperglycemia, unspecified: Secondary | ICD-10-CM

## 2017-04-01 DIAGNOSIS — R945 Abnormal results of liver function studies: Secondary | ICD-10-CM

## 2017-04-02 LAB — CMP14+EGFR
A/G RATIO: 2 (ref 1.2–2.2)
ALBUMIN: 4.3 g/dL (ref 3.5–5.5)
ALK PHOS: 72 IU/L (ref 39–117)
ALT: 30 IU/L (ref 0–32)
AST: 29 IU/L (ref 0–40)
BUN / CREAT RATIO: 17 (ref 9–23)
BUN: 15 mg/dL (ref 6–24)
Bilirubin Total: 0.4 mg/dL (ref 0.0–1.2)
CO2: 25 mmol/L (ref 20–29)
CREATININE: 0.9 mg/dL (ref 0.57–1.00)
Calcium: 9.2 mg/dL (ref 8.7–10.2)
Chloride: 103 mmol/L (ref 96–106)
GFR calc Af Amer: 82 mL/min/{1.73_m2} (ref >59)
GFR calc non Af Amer: 71 mL/min/{1.73_m2} (ref >59)
GLOBULIN, TOTAL: 2.2 g/dL (ref 1.5–4.5)
Glucose: 119 mg/dL — ABNORMAL HIGH (ref 65–99)
POTASSIUM: 4.2 mmol/L (ref 3.5–5.2)
SODIUM: 144 mmol/L (ref 134–144)
Total Protein: 6.5 g/dL (ref 6.0–8.5)

## 2017-04-02 LAB — HEPATITIS PANEL, ACUTE
HEP A IGM: NEGATIVE
HEP B S AG: NEGATIVE
Hep B C IgM: NEGATIVE
Hep C Virus Ab: <0.1 s/co ratio (ref 0.0–0.9)

## 2017-04-04 ENCOUNTER — Ambulatory Visit: Payer: BC Managed Care – PPO | Admitting: Psychology

## 2017-04-07 LAB — BAYER DCA HB A1C WAIVED: HB A1C: 5.7 % (ref <7.0)

## 2017-05-09 ENCOUNTER — Ambulatory Visit: Payer: BC Managed Care – PPO | Admitting: Psychology

## 2017-05-09 DIAGNOSIS — F331 Major depressive disorder, recurrent, moderate: Secondary | ICD-10-CM | POA: Diagnosis not present

## 2017-05-13 ENCOUNTER — Ambulatory Visit: Payer: BC Managed Care – PPO | Admitting: Family Medicine

## 2017-05-13 ENCOUNTER — Encounter: Payer: Self-pay | Admitting: Family Medicine

## 2017-05-13 VITALS — BP 114/75 | HR 78 | Temp 98.0°F | Ht 63.0 in | Wt 154.1 lb

## 2017-05-13 DIAGNOSIS — N760 Acute vaginitis: Secondary | ICD-10-CM

## 2017-05-13 DIAGNOSIS — R399 Unspecified symptoms and signs involving the genitourinary system: Secondary | ICD-10-CM

## 2017-05-13 LAB — URINALYSIS
BILIRUBIN UA: NEGATIVE
Glucose, UA: NEGATIVE
Ketones, UA: NEGATIVE
NITRITE UA: NEGATIVE
PH UA: 5.5 (ref 5.0–7.5)
Protein, UA: NEGATIVE
Specific Gravity, UA: >1.03 — ABNORMAL HIGH (ref 1.005–1.030)
UUROB: 0.2 mg/dL (ref 0.2–1.0)

## 2017-05-13 MED ORDER — METRONIDAZOLE 500 MG PO TABS: 500.0000 mg | ORAL_TABLET | Freq: Two times a day (BID) | ORAL | 0 refills | Status: DC

## 2017-05-13 NOTE — Progress Notes (Signed)
No chief complaint on file.    HPI  Patient presents today for 2-3 days of dysuria and frequency.  She states the frequency may be just because she been drinking a lot of tea recently.  She has been having some light discharge and some itching as well.  It is unusual for her to have discharge.  She denies any urgency of urination she is not have any flank pain.  No fever chills or sweats and no nausea.  PMH: Smoking status noted ROS: Per HPI  Objective: BP 114/75   Pulse 78   Temp 98 F (36.7 C) (Oral)   Ht 5\' 3"  (1.6 m)   Wt 154 lb 2 oz (69.9 kg)   BMI 27.30 kg/m  Gen: NAD, alert, cooperative with exam HEENT: NCAT, EOMI, PERRL Abd: SNTND, BS present, no guarding or organomegaly Neuro: Alert and oriented, No gross deficits  Assessment and plan:  1. UTI symptoms   2. Acute vaginitis     Meds ordered this encounter  Medications  . metroNIDAZOLE (FLAGYL) 500 MG tablet    Sig: Take 1 tablet (500 mg total) by mouth 2 (two) times daily.    Dispense:  14 tablet    Refill:  0    Orders Placed This Encounter  Procedures  . Urine Culture  . Anaerobic and Aerobic Culture  . Urinalysis    Follow up as needed.  Mechele ClaudeWarren Mathea Frieling, MD

## 2017-05-15 LAB — URINE CULTURE

## 2017-05-17 ENCOUNTER — Other Ambulatory Visit: Payer: Self-pay | Admitting: Family Medicine

## 2017-05-17 MED ORDER — CIPROFLOXACIN HCL 500 MG PO TABS: 500.0000 mg | ORAL_TABLET | Freq: Two times a day (BID) | ORAL | 0 refills | Status: DC

## 2017-05-18 LAB — ANAEROBIC AND AEROBIC CULTURE

## 2017-05-23 ENCOUNTER — Telehealth: Payer: Self-pay | Admitting: Nurse Practitioner

## 2017-05-23 ENCOUNTER — Other Ambulatory Visit: Payer: Self-pay

## 2017-05-23 ENCOUNTER — Other Ambulatory Visit: Payer: Self-pay | Admitting: Family Medicine

## 2017-05-23 MED ORDER — AMOXICILLIN 875 MG PO TABS: 875.0000 mg | ORAL_TABLET | Freq: Two times a day (BID) | ORAL | 0 refills | Status: DC

## 2017-05-23 NOTE — Telephone Encounter (Signed)
Pt took 2 1/2 days of the Cipro for her UTI having dizziness from it.  Still have some dysuria. Please advise.  [facility]

## 2017-05-23 NOTE — Telephone Encounter (Signed)
Please have her discontinue the Cipro and start taking amoxicillin.  I sent in prescription.

## 2017-05-23 NOTE — Telephone Encounter (Signed)
Patient notified of medication change. Patient verbalized understanding

## 2017-05-27 ENCOUNTER — Encounter: Payer: Self-pay | Admitting: Family Medicine

## 2017-05-27 ENCOUNTER — Ambulatory Visit: Payer: BC Managed Care – PPO | Admitting: Family Medicine

## 2017-05-27 VITALS — BP 109/61 | HR 82 | Temp 97.2°F | Ht 63.0 in | Wt 155.1 lb

## 2017-05-27 DIAGNOSIS — H81313 Aural vertigo, bilateral: Secondary | ICD-10-CM

## 2017-05-27 DIAGNOSIS — H66009 Acute suppurative otitis media without spontaneous rupture of ear drum, unspecified ear: Secondary | ICD-10-CM | POA: Diagnosis not present

## 2017-05-27 MED ORDER — AMOXICILLIN-POT CLAVULANATE 875-125 MG PO TABS: 1.0000 | ORAL_TABLET | Freq: Two times a day (BID) | ORAL | 0 refills | Status: AC

## 2017-05-27 NOTE — Progress Notes (Signed)
Subjective:  Patient ID: [name], female    DOB: 1961-04-02  Age: 57 y.o. MRN: [id number]  CC: Dizziness (pt here today c/o being dizzy and at first thought it was due to Cipro so was switched to Amoxicillin but was still having the dizziness)   HPI Claire OdeaRenee Ogletree presents for patient expresses concern for dizziness.  She says that she has had some pain in the ears.  Hearing has not been diminished.  She has noted some tinnitus.  She cannot localize it to one side or the other.  Most importantly she describes the dizziness as a room moving or spinning slowly.  She says it has been going since before she started the amoxicillin.  Unfortunately switching from Cipro to amoxicillin did not help.  Because of this she thinks it was probably not the medicine that caused the problem in the first place.  The vertigo lasts for a few minutes at most at a time.  It is not associated with any nausea or syncope.  It resolved spontaneously.  It does leave her off balance for a few moments afterwards.  It seems to be stable.  However, it is not resolving.  Neither is it getting worse.  Dep[facility] screen [facility] 05/13/2017 02/21/2017 01/26/2017  Decreased Interest 0 0 2  Down, Depressed, Hopeless 0 0 2  PHQ - 2 Score 0 0 4  Altered sleeping - - 1  Tired, decreased energy - - 3  Change in appetite - - 2  Feeling bad or failure about yourself  - - 1  Trouble concentrating - - 0  Moving slowly or fidgety/restless - - 1  Suicidal thoughts - - 0  PHQ-9 Score - - 12  Difficult doing work/chores - - Somewhat difficult    History Korinne has a past medical history of Allergy, Anxiety, McArdle's disease (HCC), and McArdle's disease (HCC) (1985).   She has a past surgical history that includes Inner ear surgery and Cesarean section.   Her family history includes Cirrhosis in her brother and father; Depression in her brother and sister; Diabetes in her father and mother; Heart disease (age of onset: 7870) in  her mother.She reports that she quit smoking about 25 years ago. she has never used smokeless tobacco. She reports that she drinks alcohol. She reports that she does not use drugs.    ROS Review of Systems  Constitutional: Negative for activity change, appetite change and fever.  HENT: Negative for congestion, rhinorrhea and sore throat.   Eyes: Negative for visual disturbance.  Respiratory: Negative for cough and shortness of breath.   Cardiovascular: Negative for chest pain and palpitations.  Gastrointestinal: Negative for abdominal pain, diarrhea and nausea.  Genitourinary: Negative for dysuria.  Musculoskeletal: Negative for arthralgias and myalgias.  Neurological: Positive for dizziness. Negative for syncope, weakness, light-headedness, numbness and headaches.    Objective:  BP 109/61   Pulse 82   Temp (!) 97.2 F (36.2 C) (Oral)   Ht 5\' 3"  (1.6 m)   Wt 155 lb 2 oz (70.4 kg)   BMI 27.48 kg/m   BP Readings from Last 3 Encounters:  05/27/17 109/61  05/13/17 114/75  02/21/17 109/67    Wt Readings from Last 3 Encounters:  05/27/17 155 lb 2 oz (70.4 kg)  05/13/17 154 lb 2 oz (69.9 kg)  02/21/17 151 lb 3.2 oz (68.6 kg)     Physical Exam  Constitutional: She is oriented to person, place, and time. She appears well-developed and well-nourished.  No distress.  HENT:  Head: Normocephalic and atraumatic.  Right Ear: Tympanic membrane is injected and perforated. A middle ear effusion is present. No decreased hearing is noted.  Left Ear: Tympanic membrane is not injected.  No middle ear effusion. No decreased hearing is noted.  Eyes: Conjunctivae are normal. Pupils are equal, round, and reactive to light.  Neck: Normal range of motion. Neck supple. No thyromegaly present.  Cardiovascular: Normal rate, regular rhythm and normal heart sounds.  No murmur heard. Pulmonary/Chest: Effort normal and breath sounds normal. No respiratory distress. She has no wheezes. She has no rales.   Abdominal: Soft. Bowel sounds are normal. She exhibits no distension. There is no tenderness.  Musculoskeletal: Normal range of motion.  Lymphadenopathy:    She has no cervical adenopathy.  Neurological: She is alert and oriented to person, place, and time.  Skin: Skin is warm and dry.  Psychiatric: She has a normal mood and affect. Her behavior is normal. Judgment and thought content normal.      Assessment & Plan:   Marya was seen today for dizziness.  Diagnoses and all orders for this visit:  Vertigo, aural, bilateral  Acute suppurative otitis media without spontaneous rupture of ear drum, recurrence not specified, unspecified laterality  Other orders -     amoxicillin-clavulanate (AUGMENTIN) 875-125 MG tablet; Take 1 tablet by mouth 2 (two) times daily for 14 days. Take all of this medication       I have discontinued Lavonya Kasik's metroNIDAZOLE, ciprofloxacin, and amoxicillin. I am also having her start on amoxicillin-clavulanate. Additionally, I am having her maintain her buPROPion, ALPRAZolam, PREMARIN, nitroGLYCERIN, TRINTELLIX, albuterol, Wheat Dextrin (BENEFIBER PO), and Polyethylene Glycol 3350 (MIRALAX PO).  Allergies as of 05/27/2017      Reactions   Bactrim [sulfamethoxazole-trimethoprim]    Hydrocodone Hives      Medication List        Accurate as of 05/27/17 11:59 PM. Always use your most recent med list.          albuterol 108 (90 Base) MCG/ACT inhaler Commonly known as:  PROVENTIL HFA;VENTOLIN HFA Inhale 2 puffs into the lungs every 6 (six) hours as needed for wheezing or shortness of breath.   ALPRAZolam 0.5 MG tablet Commonly known as:  XANAX Take 0.5 mg by mouth at bedtime as needed for sleep.   amoxicillin-clavulanate 875-125 MG tablet Commonly known as:  AUGMENTIN Take 1 tablet by mouth 2 (two) times daily for 14 days. Take all of this medication   BENEFIBER PO Take by mouth every morning.   buPROPion 150 MG 12 hr tablet Commonly  known as:  WELLBUTRIN SR Take 150 mg by mouth daily.   MIRALAX PO Take by mouth at bedtime.   nitroGLYCERIN 0.4 MG SL tablet Commonly known as:  NITROSTAT Place 1 tablet (0.4 mg total) under the tongue every 5 (five) minutes as needed for chest pain.   PREMARIN vaginal cream Generic drug:  conjugated estrogens Reported on 05/08/2015   TRINTELLIX 20 MG Tabs Generic drug:  vortioxetine HBr daily.        Follow-up: Return in about 1 month (around 06/24/2017), or if symptoms worsen or fail to improve.  Mechele Claude, M.D.

## 2017-05-30 ENCOUNTER — Encounter: Payer: Self-pay | Admitting: Family Medicine

## 2017-06-27 ENCOUNTER — Ambulatory Visit: Payer: BC Managed Care – PPO | Admitting: Psychology

## 2017-06-27 DIAGNOSIS — F331 Major depressive disorder, recurrent, moderate: Secondary | ICD-10-CM

## 2017-08-22 ENCOUNTER — Ambulatory Visit: Payer: BC Managed Care – PPO | Admitting: Psychology

## 2017-11-07 ENCOUNTER — Ambulatory Visit: Payer: BC Managed Care – PPO | Admitting: Psychology

## 2017-11-07 DIAGNOSIS — F331 Major depressive disorder, recurrent, moderate: Secondary | ICD-10-CM | POA: Diagnosis not present

## 2017-12-19 ENCOUNTER — Ambulatory Visit: Payer: BC Managed Care – PPO | Admitting: Psychology

## 2017-12-19 DIAGNOSIS — F331 Major depressive disorder, recurrent, moderate: Secondary | ICD-10-CM

## 2018-01-31 ENCOUNTER — Other Ambulatory Visit: Payer: Self-pay | Admitting: Obstetrics and Gynecology

## 2018-01-31 DIAGNOSIS — Z1231 Encounter for screening mammogram for malignant neoplasm of breast: Secondary | ICD-10-CM

## 2018-02-06 ENCOUNTER — Ambulatory Visit: Payer: BC Managed Care – PPO | Admitting: Psychology

## 2018-02-06 DIAGNOSIS — F331 Major depressive disorder, recurrent, moderate: Secondary | ICD-10-CM

## 2018-03-06 ENCOUNTER — Ambulatory Visit
Admission: RE | Admit: 2018-03-06 | Discharge: 2018-03-06 | Disposition: A | Discharge status: Home / Self Care | Payer: BC Managed Care – PPO | Source: Ambulatory Visit | Attending: Obstetrics and Gynecology | Admitting: Obstetrics and Gynecology

## 2018-03-06 DIAGNOSIS — Z1231 Encounter for screening mammogram for malignant neoplasm of breast: Secondary | ICD-10-CM

## 2018-04-03 ENCOUNTER — Ambulatory Visit: Payer: BC Managed Care – PPO | Admitting: Psychology

## 2018-04-03 DIAGNOSIS — F331 Major depressive disorder, recurrent, moderate: Secondary | ICD-10-CM | POA: Diagnosis not present

## 2018-05-02 ENCOUNTER — Encounter: Payer: Self-pay | Admitting: Family Medicine

## 2018-05-02 ENCOUNTER — Ambulatory Visit: Payer: BC Managed Care – PPO | Admitting: Family Medicine

## 2018-05-02 VITALS — BP 132/78 | HR 97 | Temp 101.1°F | Ht 63.0 in | Wt 153.4 lb

## 2018-05-02 DIAGNOSIS — J111 Influenza due to unidentified influenza virus with other respiratory manifestations: Secondary | ICD-10-CM | POA: Diagnosis not present

## 2018-05-02 DIAGNOSIS — R509 Fever, unspecified: Secondary | ICD-10-CM

## 2018-05-02 LAB — VERITOR FLU A/B WAIVED
Influenza A: POSITIVE — AB
Influenza B: NEGATIVE

## 2018-05-02 MED ORDER — OSELTAMIVIR PHOSPHATE 75 MG PO CAPS: 75.0000 mg | ORAL_CAPSULE | Freq: Two times a day (BID) | ORAL | 0 refills | Status: DC

## 2018-05-02 NOTE — Progress Notes (Signed)
Chief Complaint  Patient presents with  . Chills    pt here today c/o fever, chills, aches, headache    HPI  Patient presents today for patient presents with dry cough runny stuffy nose. Diffuse frontal headache of moderate intensity. Patient also has chills and subjective fever. Body aches worst in the back but present in the legs, shoulders, and torso as well. Has sapped the energy. Onset 2 days ago with ticklein the throat. Hit hard yesterday.Marland Kitchen.   PMH: Smoking status noted ROS: Per HPI  Objective: BP 132/78   Pulse 97   Temp (!) 101.1 F (38.4 C)   Ht 5\' 3"  (1.6 m)   Wt 153 lb 6 oz (69.6 kg)   BMI 27.17 kg/m  Gen: NAD, alert, cooperative with exam HEENT: NCAT, EOMI, PERRL CV: RRR, good S1/S2, no murmur Resp: CTABL, no wheezes, non-labored Abd: SNTND, BS present, no guarding or organomegaly Ext: No edema, warm Neuro: Alert and oriented, No gross deficits  Assessment and plan:  1. Influenza with respiratory manifestation   2. Fever, unspecified fever cause     Meds ordered this encounter  Medications  . oseltamivir (TAMIFLU) 75 MG capsule    Sig: Take 1 capsule (75 mg total) by mouth 2 (two) times daily.    Dispense:  10 capsule    Refill:  0    Orders Placed This Encounter  Procedures  . Veritor Flu A/B Waived    Order Specific Question:   Source    Answer:   nasal    Follow up as needed.  Mechele ClaudeWarren Kieley Akter, MD

## 2018-05-22 ENCOUNTER — Ambulatory Visit: Payer: BC Managed Care – PPO | Admitting: Psychology

## 2018-05-22 DIAGNOSIS — F331 Major depressive disorder, recurrent, moderate: Secondary | ICD-10-CM

## 2018-05-22 DIAGNOSIS — N390 Urinary tract infection, site not specified: Secondary | ICD-10-CM | POA: Insufficient documentation

## 2018-06-19 ENCOUNTER — Ambulatory Visit (INDEPENDENT_AMBULATORY_CARE_PROVIDER_SITE_OTHER): Payer: BC Managed Care – PPO

## 2018-06-19 ENCOUNTER — Encounter: Payer: Self-pay | Admitting: Family Medicine

## 2018-06-19 ENCOUNTER — Ambulatory Visit: Payer: BC Managed Care – PPO | Admitting: Family Medicine

## 2018-06-19 VITALS — BP 132/77 | HR 79 | Temp 98.3°F | Ht 63.0 in | Wt 155.4 lb

## 2018-06-19 DIAGNOSIS — E7404 McArdle disease: Secondary | ICD-10-CM | POA: Diagnosis not present

## 2018-06-19 DIAGNOSIS — M79601 Pain in right arm: Secondary | ICD-10-CM

## 2018-06-19 LAB — URINALYSIS
Bilirubin, UA: NEGATIVE
Glucose, UA: NEGATIVE
Ketones, UA: NEGATIVE
Nitrite, UA: NEGATIVE
Protein, UA: NEGATIVE
RBC UA: NEGATIVE
SPEC GRAV UA: 1.02 (ref 1.005–1.030)
Urobilinogen, Ur: 0.2 mg/dL (ref 0.2–1.0)
pH, UA: 7 (ref 5.0–7.5)

## 2018-06-19 NOTE — Patient Instructions (Signed)
Avoid low carbohydrate diets. Creatine 5 gram daily. Sports drinks twice daily.

## 2018-06-19 NOTE — Progress Notes (Signed)
Chief Complaint  Patient presents with  . Arm Pain    pt here today c/o right elbow pain that radiates down to her hand and up to right shoulder for a little over a month    HPI  Patient presents today for pain that extends from the elbow to the hand and up to the shoulder.  It seems to radiate each way from the elbow.  She has been diagnosed in the past with McArdle's disease and feels that this is an exacerbation of that most likely.  The pain is moderately severe.  It is been increasing little by little over the last month.  It has caused her to favor the arm but she is still able to use it.  PMH: Smoking status noted ROS: Per HPI  Objective: BP 132/77   Pulse 79   Temp 98.3 F (36.8 C) (Oral)   Ht _0  (1.6 m)   Wt 155 lb 6 oz (70.5 kg)   BMI 27.52 kg/m  Gen: NAD, alert, cooperative with exam HEENT: NCAT, EOMI, PERRL CV: RRR, good S1/S2, no murmur Resp: CTABL, no wheezes, non-labored Ext: No edema, warm.  Full range of motion right upper extremity mild tenderness at the ulnar aspect of the elbow.  Mild tenderness for resisted range of motion at the wrist.  Neuro: Alert and oriented, No gross deficits  Assessment and plan:  1. Right arm pain   2. McArdle disease ([place])   3. 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 DG Forearm Right    Standing Status:   Future    Number of Occurrences:   1    Standing Expiration Date:   08/19/2019    Order Specific Question:   Reason for Exam (SYMPTOM  OR DIAGNOSIS REQUIRED)    Answer:   right arm pain    Order Specific Question:   Is the patient pregnant?    Answer:   No    Order Specific Question:   Preferred imaging location?    Answer:   Internal  . CBC with Differential/Platelet  . CMP14+EGFR  . Urinalysis  . CK  Avoid low-carb diet.  She could use a low-fat diet.  She can apply heat.  Range of motion exercises should help.  Over-the-counter  analgesics.  Follow up as needed.  Claretta Fraise, MD

## 2018-06-20 LAB — CMP14+EGFR
ALT: 47 IU/L — ABNORMAL HIGH (ref 0–32)
AST: 54 IU/L — AB (ref 0–40)
Albumin/Globulin Ratio: 2 (ref 1.2–2.2)
Albumin: 4.4 g/dL (ref 3.8–4.9)
Alkaline Phosphatase: 78 IU/L (ref 39–117)
BUN/Creatinine Ratio: 16 (ref 9–23)
BUN: 13 mg/dL (ref 6–24)
Bilirubin Total: 0.4 mg/dL (ref 0.0–1.2)
CO2: 26 mmol/L (ref 20–29)
Calcium: 9.3 mg/dL (ref 8.7–10.2)
Chloride: 101 mmol/L (ref 96–106)
Creatinine, Ser: 0.79 mg/dL (ref 0.57–1.00)
GFR calc Af Amer: 95 mL/min/{1.73_m2} (ref >59)
GFR calc non Af Amer: 82 mL/min/{1.73_m2} (ref >59)
Globulin, Total: 2.2 g/dL (ref 1.5–4.5)
Glucose: 74 mg/dL (ref 65–99)
Potassium: 4.1 mmol/L (ref 3.5–5.2)
Sodium: 143 mmol/L (ref 134–144)
Total Protein: 6.6 g/dL (ref 6.0–8.5)

## 2018-06-20 LAB — CBC WITH DIFFERENTIAL/PLATELET
Basophils Absolute: 0.1 10*3/uL (ref 0.0–0.2)
Basos: 1 %
EOS (ABSOLUTE): 0.3 10*3/uL (ref 0.0–0.4)
Eos: 5 %
Hematocrit: 42 % (ref 34.0–46.6)
Hemoglobin: 14.2 g/dL (ref 11.1–15.9)
Immature Grans (Abs): 0 10*3/uL (ref 0.0–0.1)
Immature Granulocytes: 0 %
Lymphocytes Absolute: 2.3 10*3/uL (ref 0.7–3.1)
Lymphs: 40 %
MCH: 31.1 pg (ref 26.6–33.0)
MCHC: 33.8 g/dL (ref 31.5–35.7)
MCV: 92 fL (ref 79–97)
MONOS ABS: 0.8 10*3/uL (ref 0.1–0.9)
Monocytes: 13 %
Neutrophils Absolute: 2.4 10*3/uL (ref 1.4–7.0)
Neutrophils: 41 %
PLATELETS: 245 10*3/uL (ref 150–450)
RBC: 4.56 x10E6/uL (ref 3.77–5.28)
RDW: 13.6 % (ref 11.7–15.4)
WBC: 5.8 10*3/uL (ref 3.4–10.8)

## 2018-06-20 LAB — CK: Total CK: 1497 U/L (ref 24–173)

## 2018-06-21 ENCOUNTER — Encounter: Payer: Self-pay | Admitting: Family Medicine

## 2018-07-10 ENCOUNTER — Ambulatory Visit: Payer: BC Managed Care – PPO | Admitting: Psychology

## 2018-07-10 ENCOUNTER — Other Ambulatory Visit: Payer: Self-pay

## 2018-07-10 DIAGNOSIS — F331 Major depressive disorder, recurrent, moderate: Secondary | ICD-10-CM | POA: Diagnosis not present

## 2018-08-28 ENCOUNTER — Ambulatory Visit (INDEPENDENT_AMBULATORY_CARE_PROVIDER_SITE_OTHER): Payer: BC Managed Care – PPO | Admitting: Psychology

## 2018-08-28 DIAGNOSIS — F331 Major depressive disorder, recurrent, moderate: Secondary | ICD-10-CM

## 2018-08-31 ENCOUNTER — Other Ambulatory Visit: Payer: Self-pay

## 2018-08-31 ENCOUNTER — Ambulatory Visit: Payer: BC Managed Care – PPO | Admitting: Family Medicine

## 2018-08-31 ENCOUNTER — Encounter: Payer: Self-pay | Admitting: Family Medicine

## 2018-08-31 VITALS — BP 130/82 | HR 88 | Temp 98.1°F | Ht 63.0 in | Wt 155.8 lb

## 2018-08-31 DIAGNOSIS — N3001 Acute cystitis with hematuria: Secondary | ICD-10-CM

## 2018-08-31 DIAGNOSIS — R300 Dysuria: Secondary | ICD-10-CM

## 2018-08-31 LAB — URINALYSIS
Bilirubin, UA: NEGATIVE
Glucose, UA: NEGATIVE
Ketones, UA: NEGATIVE
Nitrite, UA: NEGATIVE
Specific Gravity, UA: >1.03 — ABNORMAL HIGH (ref 1.005–1.030)
Urobilinogen, Ur: 0.2 mg/dL (ref 0.2–1.0)
pH, UA: 5.5 (ref 5.0–7.5)

## 2018-08-31 MED ORDER — CEPHALEXIN 500 MG PO CAPS: 500.0000 mg | ORAL_CAPSULE | Freq: Two times a day (BID) | ORAL | 0 refills | Status: AC

## 2018-08-31 MED ORDER — FLUCONAZOLE 150 MG PO TABS: 150.0000 mg | ORAL_TABLET | Freq: Once | ORAL | 0 refills | Status: AC

## 2018-08-31 NOTE — Patient Instructions (Signed)
Urinary Tract Infection, Adult A urinary tract infection (UTI) is an infection of any part of the urinary tract. The urinary tract includes:  The kidneys.  The ureters.  The bladder.  The urethra. These organs make, store, and get rid of pee (urine) in the body. What are the causes? This is caused by germs (bacteria) in your genital area. These germs grow and cause swelling (inflammation) of your urinary tract. What increases the risk? You are more likely to develop this condition if:  You have a small, thin tube (catheter) to drain pee.  You cannot control when you pee or poop (incontinence).  You are female, and: ? You use these methods to prevent pregnancy: ? A medicine that kills sperm (spermicide). ? A device that blocks sperm (diaphragm). ? You have low levels of a female hormone (estrogen). ? You are pregnant.  You have genes that add to your risk.  You are sexually active.  You take antibiotic medicines.  You have trouble peeing because of: ? A prostate that is bigger than normal, if you are female. ? A blockage in the part of your body that drains pee from the bladder (urethra). ? A kidney stone. ? A nerve condition that affects your bladder (neurogenic bladder). ? Not getting enough to drink. ? Not peeing often enough.  You have other conditions, such as: ? Diabetes. ? A weak disease-fighting system (immune system). ? Sickle cell disease. ? Gout. ? Injury of the spine. What are the signs or symptoms? Symptoms of this condition include:  Needing to pee right away (urgently).  Peeing often.  Peeing small amounts often.  Pain or burning when peeing.  Blood in the pee.  Pee that smells bad or not like normal.  Trouble peeing.  Pee that is cloudy.  Fluid coming from the vagina, if you are female.  Pain in the belly or lower back. Other symptoms include:  Throwing up (vomiting).  No urge to eat.  Feeling mixed up (confused).  Being tired  and grouchy (irritable).  A fever.  Watery poop (diarrhea). How is this treated? This condition may be treated with:  Antibiotic medicine.  Other medicines.  Drinking enough water. Follow these instructions at home:  Medicines  Take over-the-counter and prescription medicines only as told by your doctor.  If you were prescribed an antibiotic medicine, take it as told by your doctor. Do not stop taking it even if you start to feel better. General instructions  Make sure you: ? Pee until your bladder is empty. ? Do not hold pee for a long time. ? Empty your bladder after sex. ? Wipe from front to back after pooping if you are a female. Use each tissue one time when you wipe.  Drink enough fluid to keep your pee pale yellow.  Keep all follow-up visits as told by your doctor. This is important. Contact a doctor if:  You do not get better after 1-2 days.  Your symptoms go away and then come back. Get help right away if:  You have very bad back pain.  You have very bad pain in your lower belly.  You have a fever.  You are sick to your stomach (nauseous).  You are throwing up. Summary  A urinary tract infection (UTI) is an infection of any part of the urinary tract.  This condition is caused by germs in your genital area.  There are many risk factors for a UTI. These include having a small, thin   tube to drain pee and not being able to control when you pee or poop.  Treatment includes antibiotic medicines for germs.  Drink enough fluid to keep your pee pale yellow. This information is not intended to replace advice given to you by your health care provider. Make sure you discuss any questions you have with your health care provider. Document Released: 09/29/2007 Document Revised: 10/20/2017 Document Reviewed: 10/20/2017 Elsevier Interactive Patient Education  2019 [employer]  

## 2018-08-31 NOTE — Progress Notes (Signed)
Subjective: CC: UTI PCP: Mechele ClaudeStacks, Warren, MD ZOX:WRUEAHPI:[name] is a 58 y.o. female presenting to clinic today for:  1. Urinary symptoms Patient reports a 1 day h/o urinary frequency, urgency, pelvic cramping.  Denies hematuria, fevers, chills, nausea, vomiting, back pain, vaginal discharge.  She does report some vaginal itching that preceded dysuria. Patient has used cranberry juice and increased water consumption for symptoms.  Patient denies a h/o frequent or recurrent UTIs.     ROS: Per HPI  Allergies  Allergen Reactions  . Bactrim [Sulfamethoxazole-Trimethoprim]   . Hydrocodone Hives   Past Medical History:  Diagnosis Date  . Allergy   . Anxiety   . McArdle's disease (HCC)   . McArdle's disease (HCC) 1985    Current Outpatient Medications:  .  ALPRAZolam (XANAX) 0.5 MG tablet, Take 0.5 mg by mouth at bedtime as needed for sleep., Disp: , Rfl:  .  buPROPion (WELLBUTRIN SR) 150 MG 12 hr tablet, Take 150 mg by mouth daily., Disp: , Rfl:  .  nitroGLYCERIN (NITROSTAT) 0.4 MG SL tablet, Place 1 tablet (0.4 mg total) under the tongue every 5 (five) minutes as needed for chest pain., Disp: 30 tablet, Rfl: 0 .  Polyethylene Glycol 3350 (MIRALAX PO), Take by mouth at bedtime., Disp: , Rfl:  .  PREMARIN vaginal cream, Reported on 05/08/2015, Disp: , Rfl:  .  TRINTELLIX 20 MG TABS, daily. , Disp: , Rfl:  .  Wheat Dextrin (BENEFIBER PO), Take by mouth every morning., Disp: , Rfl:  Social History   Socioeconomic History  . Marital status: Married    Spouse name: Not on file  . Number of children: Not on file  . Years of education: Not on file  . Highest education level: Not on file  Occupational History  . Not on file  Social Needs  . Financial resource strain: Not on file  . Food insecurity:    Worry: Not on file    Inability: Not on file  . Transportation needs:    Medical: Not on file    Non-medical: Not on file  Tobacco Use  . Smoking status: Former Smoker    Last  attempt to quit: 04/26/1992    Years since quitting: 26.3  . Smokeless tobacco: Never Used  Substance and Sexual Activity  . Alcohol use: Yes    Comment: occ  . Drug use: No  . Sexual activity: Not on file  Lifestyle  . Physical activity:    Days per week: Not on file    Minutes per session: Not on file  . Stress: Not on file  Relationships  . Social connections:    Talks on phone: Not on file    Gets together: Not on file    Attends religious service: Not on file    Active member of club or organization: Not on file    Attends meetings of clubs or organizations: Not on file    Relationship status: Not on file  . Intimate partner violence:    Fear of current or ex partner: Not on file    Emotionally abused: Not on file    Physically abused: Not on file    Forced sexual activity: Not on file  Other Topics Concern  . Not on file  Social History Narrative  . Not on file   Family History  Problem Relation Age of Onset  . Diabetes Mother   . Heart disease Mother [id number]  . Diabetes Father   . Cirrhosis Father   .  Depression Sister   . Depression Brother   . Cirrhosis Brother   . Breast cancer Neg Hx     Objective: Office vital signs reviewed. BP 130/82   Pulse 88   Temp 98.1 F (36.7 C) (Oral)   Ht 5\' 3"  (1.6 m)   Wt 155 lb 12.8 oz (70.7 kg)   BMI 27.60 kg/m   Physical Examination:  General: Awake, alert, well nourished, No acute distress GU: no suprapubic TTP. No CVA TTP  Assessment/ Plan: 58 y.o. female   1. Acute cystitis with hematuria Urine dip showed trace blood, 2+ protein and 1+ leukocytes.  Urine microscopy not available at this time.  Given symptoms and UA findings will proceed with treatment for urinary tract infection with Keflex p.o. twice daily for 7 days.  Diflucan also sent given vaginal itching and initiation of oral antibiotic.  We discussed reasons for return.  Urine culture sent and we will contact her with results once available. - cephALEXin  (KEFLEX) 500 MG capsule; Take 1 capsule (500 mg total) by mouth 2 (two) times daily for 7 days.  Dispense: 14 capsule; Refill: 0 - fluconazole (DIFLUCAN) 150 MG tablet; Take 1 tablet (150 mg total) by mouth once for 1 dose.  Dispense: 1 tablet; Refill: 0 - Urinalysis  2. Dysuria - Urine Culture   Orders Placed This Encounter  Procedures  . Urine Culture  . Urinalysis   No orders of the defined types were placed in this encounter.    Raliegh Ip, DO [facility] [phone]

## 2018-09-02 LAB — URINE CULTURE

## 2018-09-10 IMAGING — DX DG CHEST 2V
2 series · 2 of 2 positions shown · non-contrast
Comparison: No prior.

CLINICAL DATA: Cough.  Pain.

EXAM:
CHEST  2 VIEW

[chest pa]
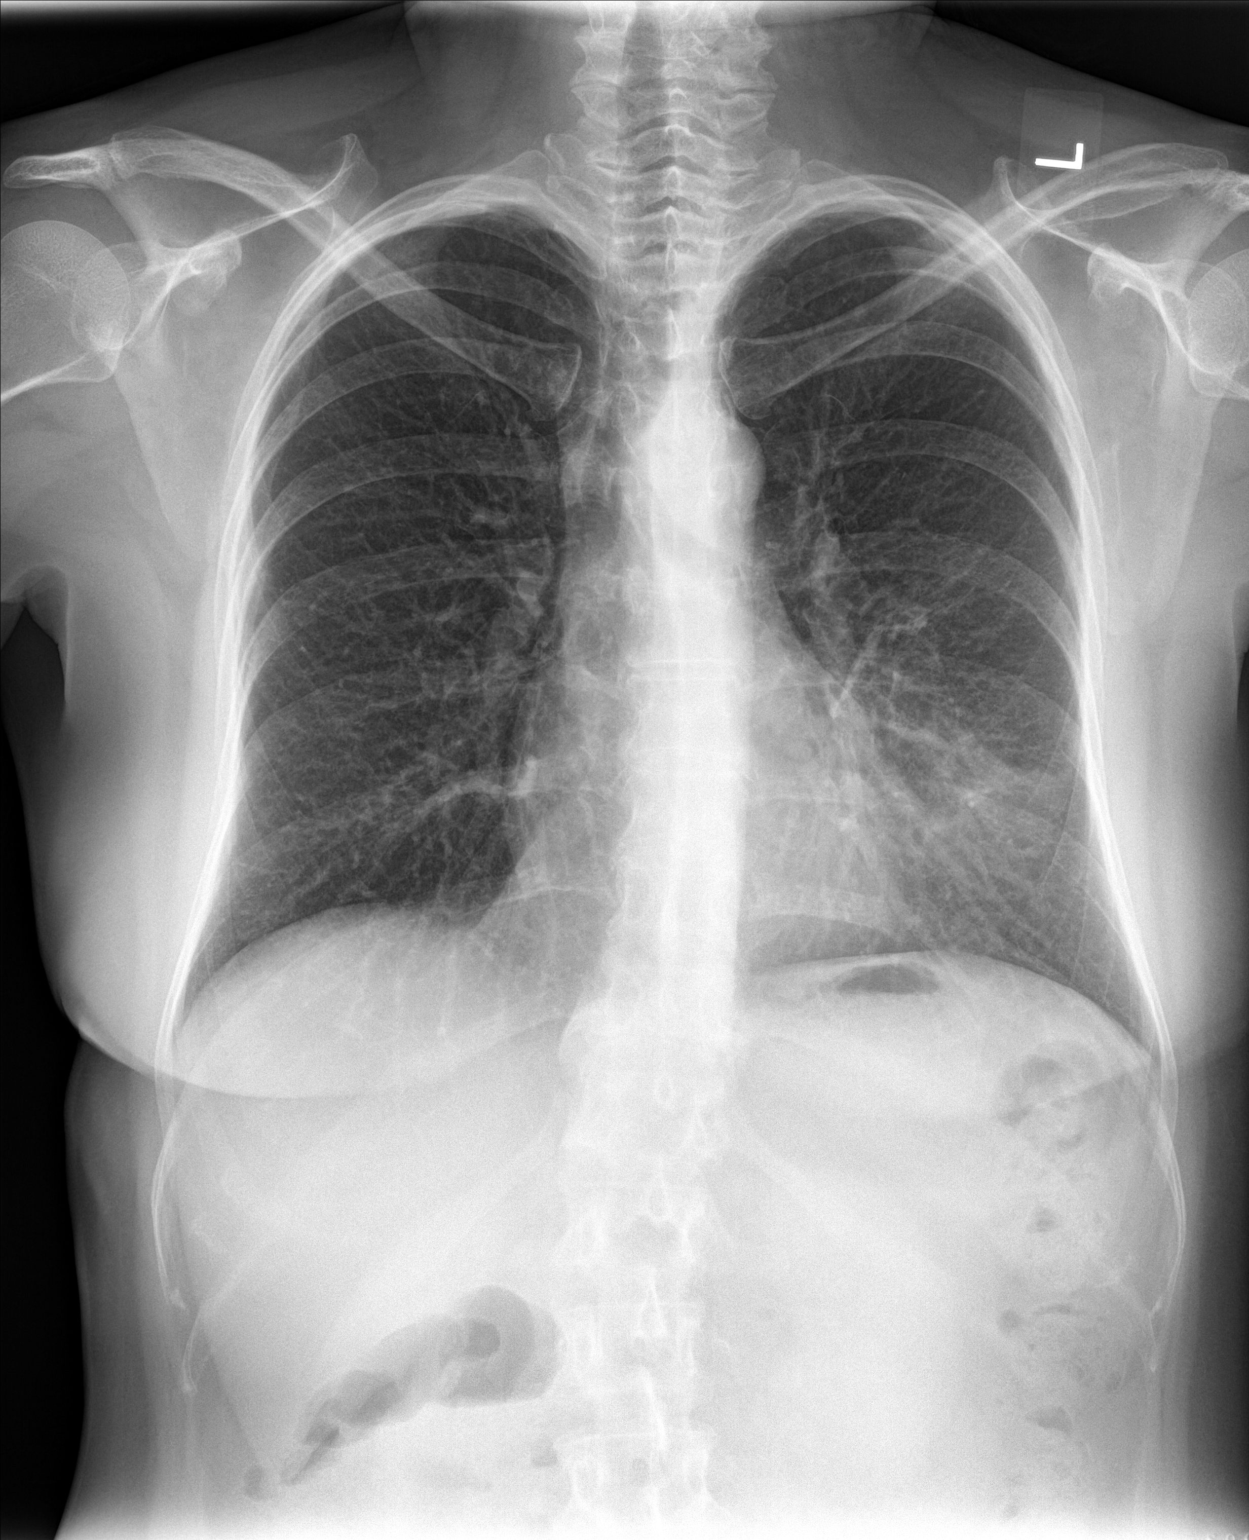

[chest lat]
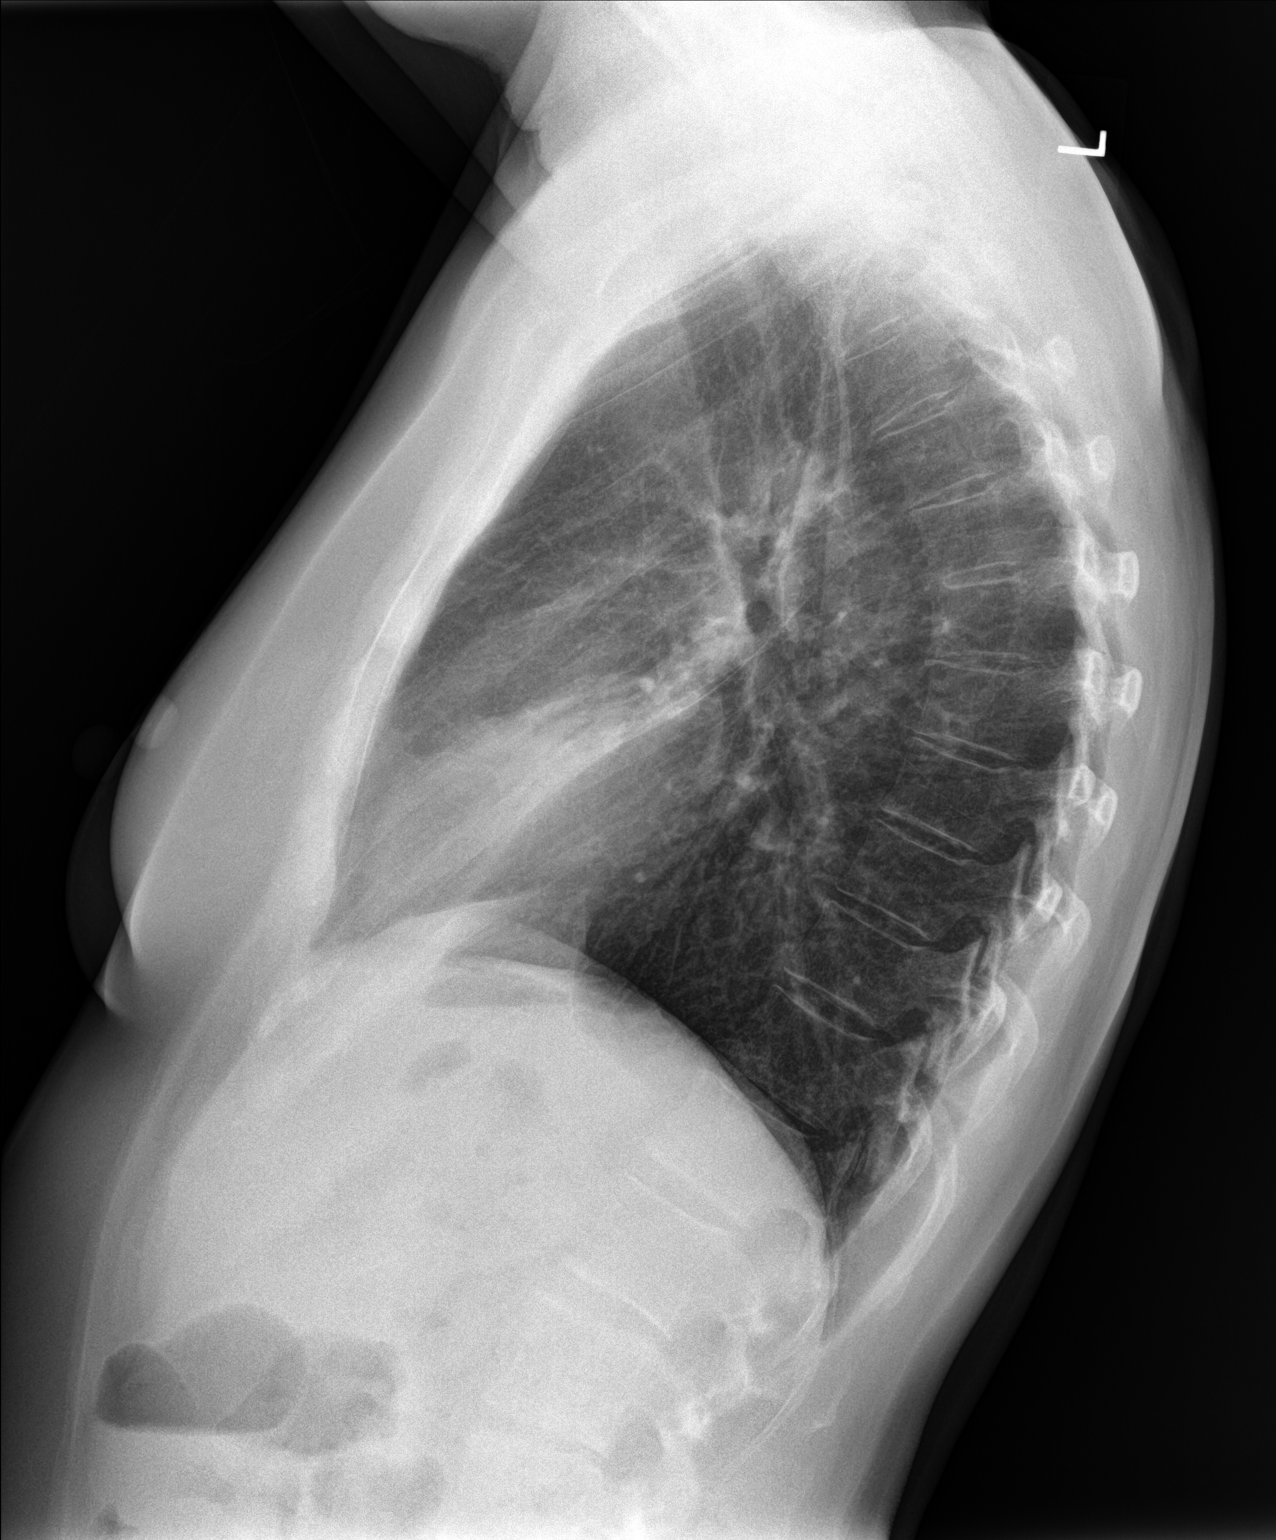

[2 of 2 positions shown; findings below may reference images not displayed]

FINDINGS: Mediastinum hilar structures normal. Lingular infiltrate noted
consistent pneumonia. No pleural effusion or pneumothorax. Heart
size normal. No acute bony abnormality . Degenerative changes
thoracic spine.
IMPRESSION: Lingular infiltrate noted consistent with pneumonia.

## 2018-09-18 IMAGING — DX DG CHEST 2V
2 series · 2 of 2 positions shown · non-contrast
Comparison: 02/12/2016

CLINICAL DATA: Followup pneumonia.

EXAM:
CHEST  2 VIEW

[chest pa]
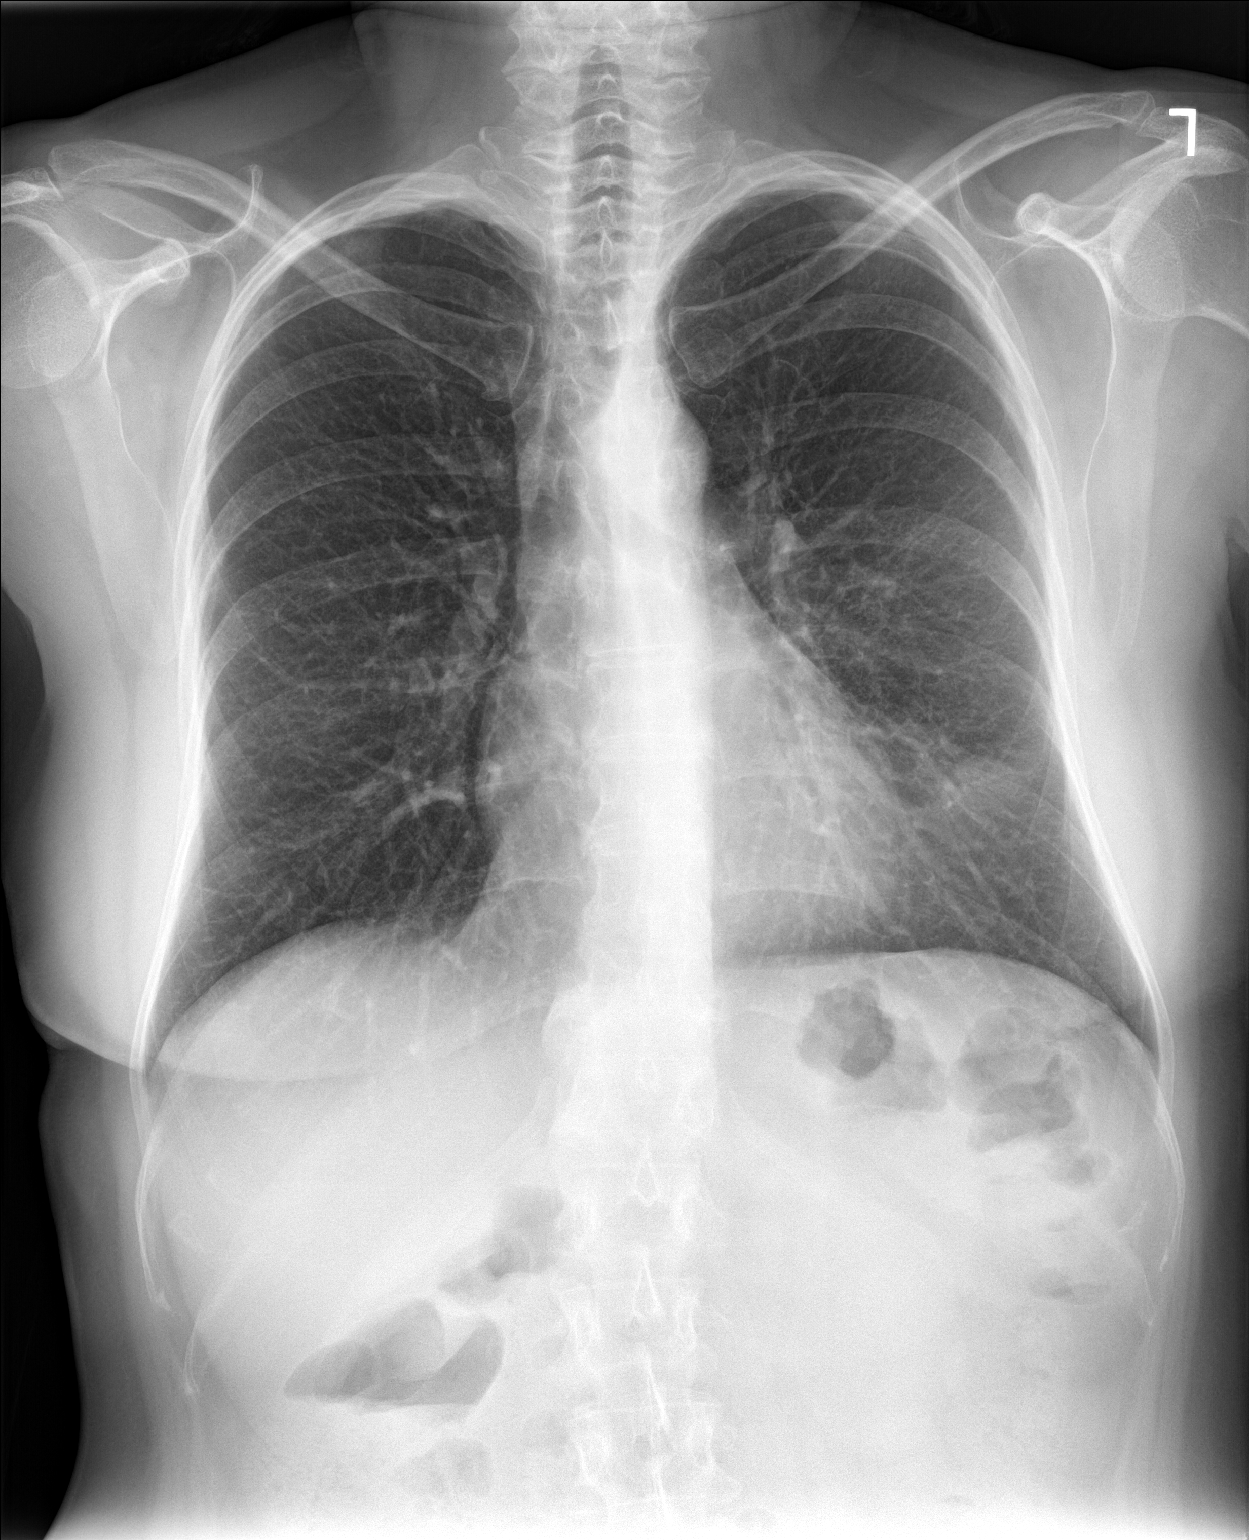

[chest lat]
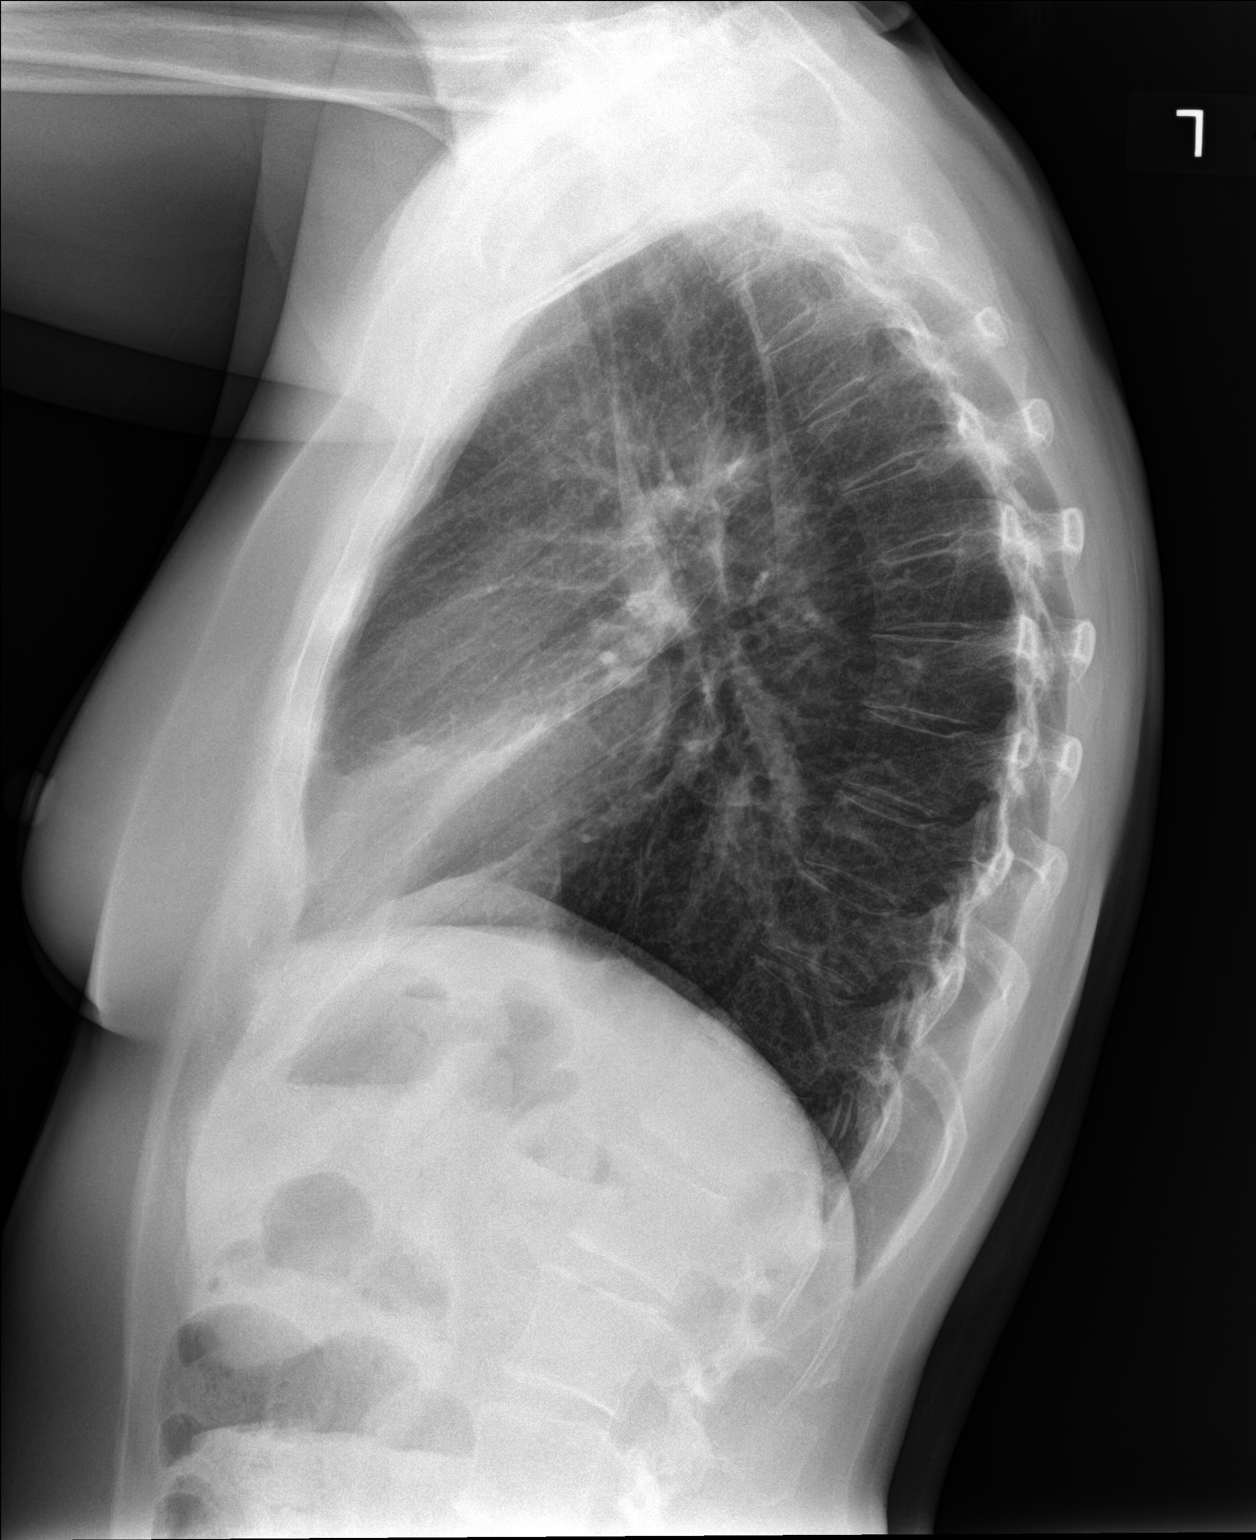

[2 of 2 positions shown; findings below may reference images not displayed]

FINDINGS: Improved appearance of the lingular pneumonia. There is some
residual consolidation and or post pneumonic atelectasis. I would
recommend 1 more follow-up chest x-ray in 4 weeks. No pleural
effusion. The right lung is clear. The bony thorax is intact.
IMPRESSION: Improved but persistent infiltrate. Recommend four-week follow-up
chest x-ray.

## 2018-09-25 ENCOUNTER — Ambulatory Visit (INDEPENDENT_AMBULATORY_CARE_PROVIDER_SITE_OTHER): Payer: BC Managed Care – PPO | Admitting: Psychology

## 2018-09-25 DIAGNOSIS — F331 Major depressive disorder, recurrent, moderate: Secondary | ICD-10-CM

## 2018-11-06 ENCOUNTER — Ambulatory Visit (INDEPENDENT_AMBULATORY_CARE_PROVIDER_SITE_OTHER): Payer: BC Managed Care – PPO | Admitting: Psychology

## 2018-11-06 DIAGNOSIS — F331 Major depressive disorder, recurrent, moderate: Secondary | ICD-10-CM

## 2018-12-18 ENCOUNTER — Ambulatory Visit (INDEPENDENT_AMBULATORY_CARE_PROVIDER_SITE_OTHER): Payer: BC Managed Care – PPO | Admitting: Psychology

## 2018-12-18 DIAGNOSIS — F331 Major depressive disorder, recurrent, moderate: Secondary | ICD-10-CM | POA: Diagnosis not present

## 2019-01-29 ENCOUNTER — Ambulatory Visit (INDEPENDENT_AMBULATORY_CARE_PROVIDER_SITE_OTHER): Payer: BC Managed Care – PPO | Admitting: Psychology

## 2019-01-29 DIAGNOSIS — F331 Major depressive disorder, recurrent, moderate: Secondary | ICD-10-CM

## 2019-02-12 ENCOUNTER — Encounter: Payer: Self-pay | Admitting: Family Medicine

## 2019-02-12 ENCOUNTER — Other Ambulatory Visit: Payer: Self-pay

## 2019-02-12 ENCOUNTER — Ambulatory Visit: Payer: BC Managed Care – PPO | Admitting: Family Medicine

## 2019-02-12 VITALS — BP 116/73 | HR 74 | Temp 98.9°F | Resp 20 | Ht 63.0 in | Wt 151.0 lb

## 2019-02-12 DIAGNOSIS — N3091 Cystitis, unspecified with hematuria: Secondary | ICD-10-CM

## 2019-02-12 DIAGNOSIS — R300 Dysuria: Secondary | ICD-10-CM

## 2019-02-12 DIAGNOSIS — N898 Other specified noninflammatory disorders of vagina: Secondary | ICD-10-CM | POA: Diagnosis not present

## 2019-02-12 LAB — MICROSCOPIC EXAMINATION: Renal Epithel, UA: NONE SEEN /hpf

## 2019-02-12 LAB — URINALYSIS, COMPLETE
Bilirubin, UA: NEGATIVE
Glucose, UA: NEGATIVE
Ketones, UA: NEGATIVE
Leukocytes,UA: NEGATIVE
Nitrite, UA: NEGATIVE
Protein,UA: NEGATIVE
Specific Gravity, UA: 1.025 (ref 1.005–1.030)
Urobilinogen, Ur: 0.2 mg/dL (ref 0.2–1.0)
pH, UA: 6 (ref 5.0–7.5)

## 2019-02-12 LAB — WET PREP FOR TRICH, YEAST, CLUE
Clue Cell Exam: NEGATIVE
Trichomonas Exam: NEGATIVE
Yeast Exam: NEGATIVE

## 2019-02-12 MED ORDER — AMOXICILLIN-POT CLAVULANATE 875-125 MG PO TABS: 1.0000 | ORAL_TABLET | Freq: Two times a day (BID) | ORAL | 0 refills | Status: AC

## 2019-02-12 NOTE — Patient Instructions (Signed)

## 2019-02-12 NOTE — Progress Notes (Signed)
IRC:VELFYB, Cletus Gash, MD Chief Complaint  Patient presents with  . Urinary Tract Infection    Current Issues:  Presents with 1 week of urgency, hesitency, dysuria   Associated symptoms include:  lower abdominal pain, urinary hesitancy, urinary urgency and vaginal itching, vaginal irritation  There is a previous history of of similar symptoms. Sexually active:  Yes with female.   No concern for STI.  Prior to Admission medications   Medication Sig Start Date End Date Taking? Authorizing Provider  ALPRAZolam Duanne Moron) 0.5 MG tablet Take 0.5 mg by mouth at bedtime as needed for sleep.   Yes [provider]  buPROPion (WELLBUTRIN SR) 150 MG 12 hr tablet Take 150 mg by mouth daily.   Yes [provider]  escitalopram (LEXAPRO) 20 MG tablet Take 1 tablet by mouth daily. 01/25/19  Yes [provider]  Polyethylene Glycol 3350 (MIRALAX PO) Take by mouth at bedtime.   Yes [provider]  UNABLE TO FIND Med Name:  CBD oil (ggts)   Yes [provider]  Wheat Dextrin (BENEFIBER PO) Take by mouth every morning.   Yes [provider]  nitroGLYCERIN (NITROSTAT) 0.4 MG SL tablet Place 1 tablet (0.4 mg total) under the tongue every 5 (five) minutes as needed for chest pain. Patient not taking: Reported on 02/12/2019 01/15/16   Chevis Pretty, FNP  PREMARIN vaginal cream Reported on 05/08/2015 12/24/13   [provider]    :Review of Systems  Constitutional: Negative for fever.  Gastrointestinal: Positive for abdominal pain (lower, cramping). Negative for blood in stool, nausea and vomiting.  Genitourinary: Positive for dysuria, urgency and vaginal pain (burning sensation). Negative for flank pain, frequency, hematuria and vaginal discharge.  Musculoskeletal: Negative for back pain.     PE:  BP 116/73   Pulse 74   Temp 98.9 F (37.2 C)   Resp 20   Ht 5\' 3"  (1.6 m)   Wt 151 lb (68.5 kg)   SpO2 98%   BMI 26.75 kg/m  Physical  Exam Constitutional:      General: She is not in acute distress.    Appearance: Normal appearance. She is not ill-appearing or toxic-appearing.  Eyes:     Extraocular Movements: Extraocular movements intact.     Pupils: Pupils are equal, round, and reactive to light.  Cardiovascular:     Rate and Rhythm: Normal rate and regular rhythm.     Heart sounds: Normal heart sounds. No murmur.  Pulmonary:     Effort: Pulmonary effort is normal. No respiratory distress.     Breath sounds: Normal breath sounds.  Abdominal:     General: Bowel sounds are normal.     Palpations: Abdomen is soft.     Tenderness: There is no abdominal tenderness. There is no right CVA tenderness, left CVA tenderness, guarding or rebound.  Neurological:     Mental Status: She is alert and oriented to person, place, and time. Mental status is at baseline.  Skin:    General: Skin is warm and dry.     Capillary Refill: Capillary refill takes less than 2 seconds.  Psychiatric:        Mood and Affect: Mood normal.        Behavior: Behavior normal.        Thought Content: Thought content normal.        Judgment: Judgment normal.      Results for orders placed or performed in visit on 08/31/18  Urine Culture   Specimen:  Urine   URINE  Result Value Ref Range   Urine Culture, Routine Final report (A)    Organism ID, Bacteria Comment (A)   Urinalysis  Result Value Ref Range   Specific Gravity, UA >1.030 (H) 1.005 - 1.030   pH, UA 5.5 5.0 - 7.5   Color, UA Yellow Yellow   Appearance Ur Clear Clear   Leukocytes,UA 1+ (A) Negative   Protein,UA 2+ (A) Negative/Trace   Glucose, UA Negative Negative   Ketones, UA Negative Negative   RBC, UA Trace (A) Negative   Bilirubin, UA Negative Negative   Urobilinogen, Ur 0.2 0.2 - 1.0 mg/dL   Nitrite, UA Negative Negative    Assessment and Plan: Katrinka was seen today for urinary tract infection.  Diagnoses and all orders for this visit:  Dysuria Cystitis with  hematuria UA with trace blood and few bacteria. Culture pending. Treat with augmentin BID x 7 days based on last urine culture susceptibilities. Will adjust if needed pending culture results. Follow up in 2-3 weeks to assess for resolution of hematuria.  -     Urine Culture -     Urinalysis, Complete  Vaginal irritation Wet prep negative for trich, yeast, and clue cells. -     WET PREP FOR TRICH, YEAST, CLUE  The above assessment and management plan was discussed with the patient. The patient verbalized understanding of and has agreed to the management plan. Patient is aware to call the clinic if they develop any new symptoms or if symptoms fail to improve or worsen. Patient is aware when to return to the clinic for a follow-up visit. Patient educated on when it is appropriate to go to the emergency department.   Harlow Mares, RN, FNP student [facility] [address] [place], [place] [address] [phone]

## 2019-02-14 LAB — URINE CULTURE

## 2019-03-12 ENCOUNTER — Ambulatory Visit (INDEPENDENT_AMBULATORY_CARE_PROVIDER_SITE_OTHER): Payer: BC Managed Care – PPO | Admitting: Psychology

## 2019-03-12 DIAGNOSIS — F331 Major depressive disorder, recurrent, moderate: Secondary | ICD-10-CM

## 2019-04-03 ENCOUNTER — Other Ambulatory Visit: Payer: Self-pay | Admitting: Obstetrics and Gynecology

## 2019-04-03 DIAGNOSIS — Z1231 Encounter for screening mammogram for malignant neoplasm of breast: Secondary | ICD-10-CM

## 2019-04-05 ENCOUNTER — Other Ambulatory Visit: Payer: Self-pay

## 2019-04-05 DIAGNOSIS — Z20822 Contact with and (suspected) exposure to covid-19: Secondary | ICD-10-CM

## 2019-04-06 LAB — NOVEL CORONAVIRUS, NAA: SARS-CoV-2, NAA: NOT DETECTED

## 2019-04-09 ENCOUNTER — Other Ambulatory Visit: Payer: Self-pay

## 2019-04-09 DIAGNOSIS — Z20822 Contact with and (suspected) exposure to covid-19: Secondary | ICD-10-CM

## 2019-04-10 LAB — NOVEL CORONAVIRUS, NAA: SARS-CoV-2, NAA: NOT DETECTED

## 2019-04-23 ENCOUNTER — Ambulatory Visit (INDEPENDENT_AMBULATORY_CARE_PROVIDER_SITE_OTHER): Payer: BC Managed Care – PPO | Admitting: Psychology

## 2019-04-23 DIAGNOSIS — F331 Major depressive disorder, recurrent, moderate: Secondary | ICD-10-CM | POA: Diagnosis not present

## 2019-05-21 ENCOUNTER — Ambulatory Visit
Admission: RE | Admit: 2019-05-21 | Discharge: 2019-05-21 | Disposition: A | Discharge status: Home / Self Care | Payer: BC Managed Care – PPO | Source: Ambulatory Visit | Attending: Obstetrics and Gynecology | Admitting: Obstetrics and Gynecology

## 2019-05-21 ENCOUNTER — Other Ambulatory Visit: Payer: Self-pay

## 2019-05-21 DIAGNOSIS — Z1231 Encounter for screening mammogram for malignant neoplasm of breast: Secondary | ICD-10-CM

## 2019-06-18 ENCOUNTER — Ambulatory Visit (INDEPENDENT_AMBULATORY_CARE_PROVIDER_SITE_OTHER): Payer: BC Managed Care – PPO | Admitting: Psychology

## 2019-06-18 DIAGNOSIS — F331 Major depressive disorder, recurrent, moderate: Secondary | ICD-10-CM | POA: Diagnosis not present

## 2019-08-13 ENCOUNTER — Ambulatory Visit (INDEPENDENT_AMBULATORY_CARE_PROVIDER_SITE_OTHER): Payer: BC Managed Care – PPO | Admitting: Psychology

## 2019-08-13 DIAGNOSIS — F331 Major depressive disorder, recurrent, moderate: Secondary | ICD-10-CM

## 2019-09-06 ENCOUNTER — Encounter: Payer: Self-pay | Admitting: Family

## 2019-09-06 ENCOUNTER — Telehealth (INDEPENDENT_AMBULATORY_CARE_PROVIDER_SITE_OTHER): Payer: BC Managed Care – PPO | Admitting: Family

## 2019-09-06 DIAGNOSIS — B3731 Acute candidiasis of vulva and vagina: Secondary | ICD-10-CM

## 2019-09-06 DIAGNOSIS — B373 Candidiasis of vulva and vagina: Secondary | ICD-10-CM

## 2019-09-06 DIAGNOSIS — R399 Unspecified symptoms and signs involving the genitourinary system: Secondary | ICD-10-CM | POA: Diagnosis not present

## 2019-09-06 MED ORDER — CEPHALEXIN 500 MG PO CAPS: 500.0000 mg | ORAL_CAPSULE | Freq: Two times a day (BID) | ORAL | 0 refills | Status: DC

## 2019-09-06 MED ORDER — FLUCONAZOLE 150 MG PO TABS: 150.0000 mg | ORAL_TABLET | ORAL | 0 refills | Status: DC | PRN

## 2019-09-06 NOTE — Progress Notes (Signed)
   Virtual Visit via telephone Note Due to COVID-19 pandemic this visit was conducted virtually. This visit type was conducted due to national recommendations for restrictions regarding the COVID-19 Pandemic (e.g. social distancing, sheltering in place) in an effort to limit this patient's exposure and mitigate transmission in our community. All issues noted in this document were discussed and addressed.  A physical exam was not performed with this format.  I connected with [name] on 09/06/19 at 8:05 AM by telephone and video  and verified that I am speaking with the correct person using two identifiers. [name] is currently located at work and no one is currently with her during visit. The provider, Jannifer Rodney, FNP is located in their office at time of visit.  I discussed the limitations, risks, security and privacy concerns of performing an evaluation and management service by telephone and the availability of in person appointments. I also discussed with the patient that there may be a patient responsible charge related to this service. The patient expressed understanding and agreed to proceed.   History and Present Illness:  Dysuria  This is a new problem. The current episode started in the past 7 days. The problem occurs intermittently. The problem has been waxing and waning. The quality of the pain is described as burning. The pain is mild. Associated symptoms include frequency, hesitancy and urgency. Pertinent negatives include no discharge, flank pain, hematuria, nausea or vomiting. She has tried increased fluids for the symptoms. The treatment provided mild relief.  Vaginal Itching The patient's primary symptoms include genital itching. This is a new problem. The current episode started 1 to 4 weeks ago. Associated symptoms include dysuria, frequency and urgency. Pertinent negatives include no flank pain, hematuria, nausea or vomiting.      Review of Systems   Gastrointestinal: Negative for nausea and vomiting.  Genitourinary: Positive for dysuria, frequency, hesitancy and urgency. Negative for flank pain and hematuria.  All other systems reviewed and are negative.    Observations/Objective: No SOB or distress noted, pt looks well.  Assessment and Plan: 1. UTI symptoms Force fluids AZO over the counter X2 days RTO if symptoms worsen or do not improve - cephALEXin (KEFLEX) 500 MG capsule; Take 1 capsule (500 mg total) by mouth 2 (two) times daily.  Dispense: 14 capsule; Refill: 0  2. Vagina, candidiasis Eat yogurt - fluconazole (DIFLUCAN) 150 MG tablet; Take 1 tablet (150 mg total) by mouth every three (3) days as needed.  Dispense: 3 tablet; Refill: 0     I discussed the assessment and treatment plan with the patient. The patient was provided an opportunity to ask questions and all were answered. The patient agreed with the plan and demonstrated an understanding of the instructions.   The patient was advised to call back or seek an in-person evaluation if the symptoms worsen or if the condition fails to improve as anticipated.  The above assessment and management plan was discussed with the patient. The patient verbalized understanding of and has agreed to the management plan. Patient is aware to call the clinic if symptoms persist or worsen. Patient is aware when to return to the clinic for a follow-up visit. Patient educated on when it is appropriate to go to the emergency department.   Time call ended: 8:14 AM    I provided 9 minutes of non- and face-to-face time during this encounter.    Jannifer Rodney, FNP

## 2019-09-20 IMAGING — DX DG CHEST 2V
2 series · 2 of 2 positions shown · non-contrast
Comparison: 04/12/2016

CLINICAL DATA: Congestion and chills.

EXAM:
CHEST  2 VIEW

[chest pa]
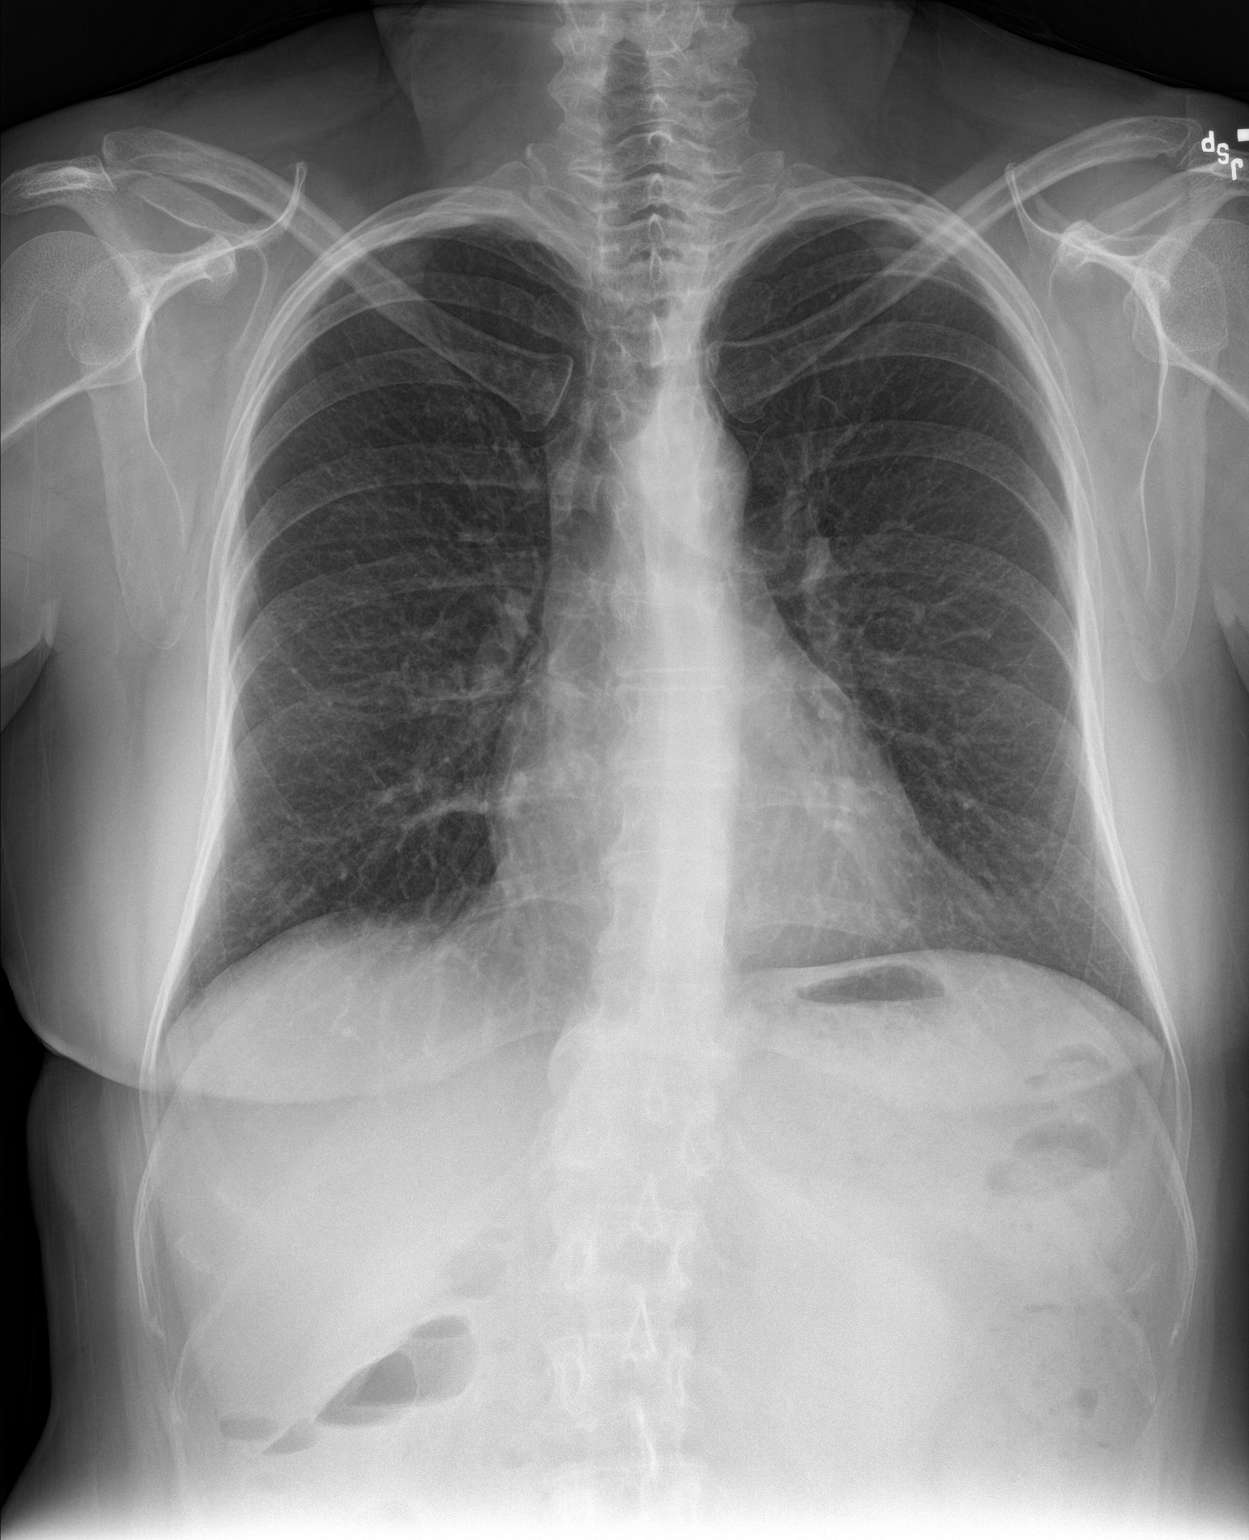

[chest lat]
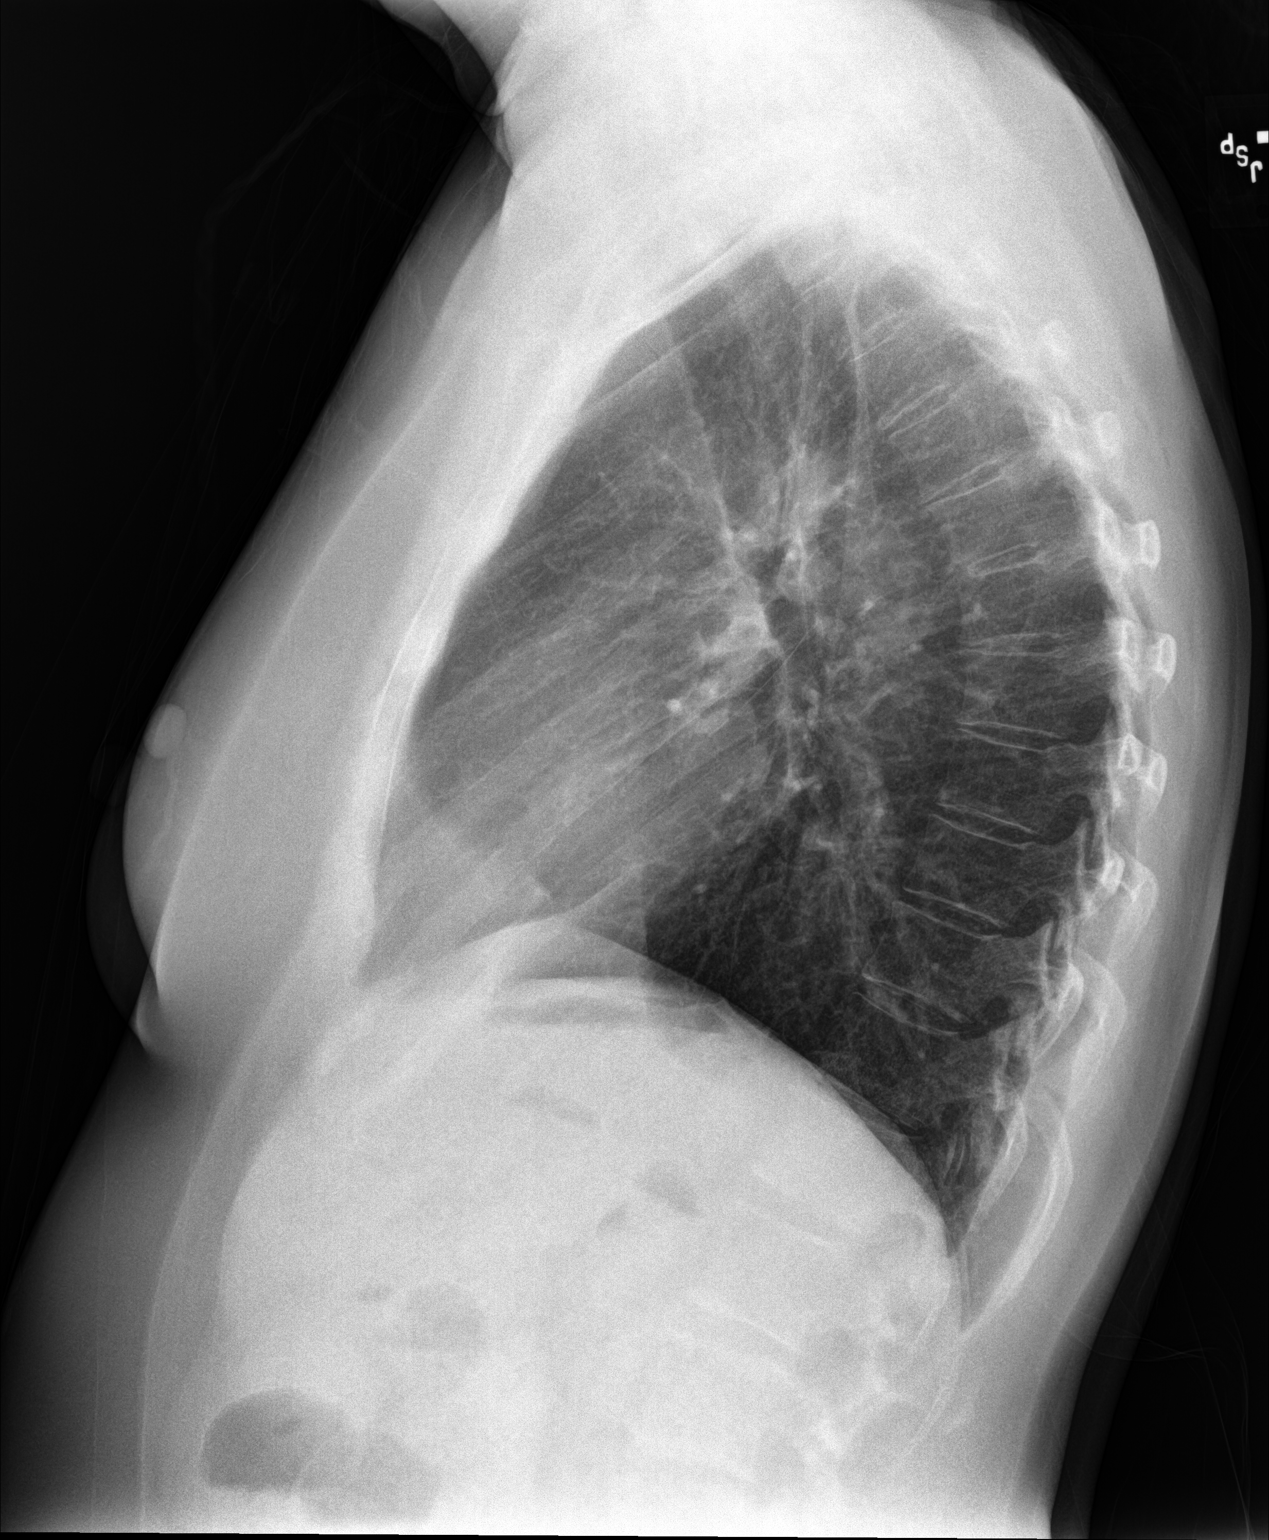

[2 of 2 positions shown; findings below may reference images not displayed]

FINDINGS: The heart size and mediastinal contours are within normal limits.
There is no evidence of pulmonary edema, consolidation,
pneumothorax, nodule or pleural fluid. The visualized skeletal
structures are unremarkable.
IMPRESSION: No active cardiopulmonary disease.

## 2019-10-01 ENCOUNTER — Ambulatory Visit (INDEPENDENT_AMBULATORY_CARE_PROVIDER_SITE_OTHER): Payer: BC Managed Care – PPO | Admitting: Psychology

## 2019-10-01 DIAGNOSIS — F331 Major depressive disorder, recurrent, moderate: Secondary | ICD-10-CM

## 2019-11-26 ENCOUNTER — Ambulatory Visit (INDEPENDENT_AMBULATORY_CARE_PROVIDER_SITE_OTHER): Payer: BC Managed Care – PPO | Admitting: Psychology

## 2019-11-26 DIAGNOSIS — F331 Major depressive disorder, recurrent, moderate: Secondary | ICD-10-CM

## 2020-01-15 ENCOUNTER — Ambulatory Visit (INDEPENDENT_AMBULATORY_CARE_PROVIDER_SITE_OTHER): Payer: BC Managed Care – PPO | Admitting: Nurse Practitioner

## 2020-01-15 DIAGNOSIS — R300 Dysuria: Secondary | ICD-10-CM

## 2020-01-15 LAB — URINALYSIS, COMPLETE
Bilirubin, UA: NEGATIVE
Glucose, UA: NEGATIVE
Ketones, UA: NEGATIVE
Leukocytes,UA: NEGATIVE
Nitrite, UA: NEGATIVE
Protein,UA: NEGATIVE
RBC, UA: NEGATIVE
Specific Gravity, UA: 1.02 (ref 1.005–1.030)
Urobilinogen, Ur: 0.2 mg/dL (ref 0.2–1.0)
pH, UA: 7 (ref 5.0–7.5)

## 2020-01-15 LAB — MICROSCOPIC EXAMINATION
Bacteria, UA: NONE SEEN
Epithelial Cells (non renal): NONE SEEN /hpf (ref 0–10)
RBC, Urine: NONE SEEN /hpf (ref 0–2)
WBC, UA: NONE SEEN /hpf (ref 0–5)

## 2020-01-15 NOTE — Progress Notes (Signed)
   Virtual Visit via telephone Note Due to COVID-19 pandemic this visit was conducted virtually. This visit type was conducted due to national recommendations for restrictions regarding the COVID-19 Pandemic (e.g. social distancing, sheltering in place) in an effort to limit this patient's exposure and mitigate transmission in our community. All issues noted in this document were discussed and addressed.  A physical exam was not performed with this format.  I connected with [name] on 01/15/20 at 3:10 by telephone and verified that I am speaking with the correct person using two identifiers. [name] is currently located at work and no one is currently with her during visit. The provider, Mary-Margaret Daphine Deutscher, FNP is located in their office at time of visit.  I discussed the limitations, risks, security and privacy concerns of performing an evaluation and management service by telephone and the availability of in person appointments. I also discussed with the patient that there may be a patient responsible charge related to this service. The patient expressed understanding and agreed to proceed.   History and Present Illness:   Chief Complaint: Dysuria   HPI Patient calls in c/o back pain radiating around to pelvis. She has had some burning when she is voiding. Drinks a lot of fluids so she goes to the rest room a lot. She says her back pain is in lower left back. denies fever or hematuria.    Review of Systems  Constitutional: Negative for chills and fever.  Respiratory: Negative.   Cardiovascular: Negative.   Genitourinary: Negative.   Neurological: Negative.   Psychiatric/Behavioral: Negative.   All other systems reviewed and are negative.    Observations/Objective: Alert and oriented- answers all questions appropriately No distress    Assessment and Plan: [name] in today with chief complaint of Dysuria   1. Dysuria Force fluids Urine is clear If  worsens let me know - Urinalysis, Complete     Follow Up Instructions: prn    I discussed the assessment and treatment plan with the patient. The patient was provided an opportunity to ask questions and all were answered. The patient agreed with the plan and demonstrated an understanding of the instructions.   The patient was advised to call back or seek an in-person evaluation if the symptoms worsen or if the condition fails to improve as anticipated.  The above assessment and management plan was discussed with the patient. The patient verbalized understanding of and has agreed to the management plan. Patient is aware to call the clinic if symptoms persist or worsen. Patient is aware when to return to the clinic for a follow-up visit. Patient educated on when it is appropriate to go to the emergency department.   Time call ended:  3:30  I provided 20 minutes of non-face-to-face time during this encounter.    Mary-Margaret Daphine Deutscher, FNP

## 2020-01-21 ENCOUNTER — Ambulatory Visit (INDEPENDENT_AMBULATORY_CARE_PROVIDER_SITE_OTHER): Payer: BC Managed Care – PPO | Admitting: Psychology

## 2020-01-21 DIAGNOSIS — F331 Major depressive disorder, recurrent, moderate: Secondary | ICD-10-CM

## 2020-03-17 ENCOUNTER — Ambulatory Visit (INDEPENDENT_AMBULATORY_CARE_PROVIDER_SITE_OTHER): Payer: BC Managed Care – PPO | Admitting: Psychology

## 2020-03-17 DIAGNOSIS — F331 Major depressive disorder, recurrent, moderate: Secondary | ICD-10-CM | POA: Diagnosis not present

## 2020-05-06 ENCOUNTER — Other Ambulatory Visit: Payer: Self-pay | Admitting: Obstetrics and Gynecology

## 2020-05-06 DIAGNOSIS — Z1231 Encounter for screening mammogram for malignant neoplasm of breast: Secondary | ICD-10-CM

## 2020-05-12 ENCOUNTER — Ambulatory Visit (INDEPENDENT_AMBULATORY_CARE_PROVIDER_SITE_OTHER): Payer: BC Managed Care – PPO | Admitting: Psychology

## 2020-05-12 DIAGNOSIS — F331 Major depressive disorder, recurrent, moderate: Secondary | ICD-10-CM

## 2020-05-27 ENCOUNTER — Ambulatory Visit (INDEPENDENT_AMBULATORY_CARE_PROVIDER_SITE_OTHER): Payer: BC Managed Care – PPO | Admitting: Family Medicine

## 2020-05-27 ENCOUNTER — Other Ambulatory Visit: Payer: Self-pay

## 2020-05-27 ENCOUNTER — Encounter: Payer: Self-pay | Admitting: Family Medicine

## 2020-05-27 VITALS — BP 117/77 | HR 81 | Temp 97.9°F | Ht 63.0 in | Wt 155.8 lb

## 2020-05-27 DIAGNOSIS — E559 Vitamin D deficiency, unspecified: Secondary | ICD-10-CM

## 2020-05-27 DIAGNOSIS — R072 Precordial pain: Secondary | ICD-10-CM

## 2020-05-27 DIAGNOSIS — Z0001 Encounter for general adult medical examination with abnormal findings: Secondary | ICD-10-CM | POA: Diagnosis not present

## 2020-05-27 DIAGNOSIS — E7404 McArdle disease: Secondary | ICD-10-CM | POA: Insufficient documentation

## 2020-05-27 DIAGNOSIS — Z1211 Encounter for screening for malignant neoplasm of colon: Secondary | ICD-10-CM

## 2020-05-27 DIAGNOSIS — Z0000 Encounter for general adult medical examination without abnormal findings: Secondary | ICD-10-CM

## 2020-05-27 DIAGNOSIS — F3341 Major depressive disorder, recurrent, in partial remission: Secondary | ICD-10-CM

## 2020-05-27 LAB — URINALYSIS
Bilirubin, UA: NEGATIVE
Glucose, UA: NEGATIVE
Ketones, UA: NEGATIVE
Nitrite, UA: NEGATIVE
Protein,UA: NEGATIVE
Specific Gravity, UA: 1.02 (ref 1.005–1.030)
Urobilinogen, Ur: 0.2 mg/dL (ref 0.2–1.0)
pH, UA: 5.5 (ref 5.0–7.5)

## 2020-05-27 LAB — CBC WITH DIFFERENTIAL/PLATELET
Basophils Absolute: 0 10*3/uL (ref 0.0–0.2)
Basos: 1 %
EOS (ABSOLUTE): 0.1 10*3/uL (ref 0.0–0.4)
Eos: 3 %
Hematocrit: 44.2 % (ref 34.0–46.6)
Hemoglobin: 14.1 g/dL (ref 11.1–15.9)
Immature Grans (Abs): 0 10*3/uL (ref 0.0–0.1)
Immature Granulocytes: 0 %
Lymphocytes Absolute: 1.6 10*3/uL (ref 0.7–3.1)
Lymphs: 32 %
MCH: 27.5 pg (ref 26.6–33.0)
MCHC: 31.9 g/dL (ref 31.5–35.7)
MCV: 86 fL (ref 79–97)
Monocytes Absolute: 0.5 10*3/uL (ref 0.1–0.9)
Monocytes: 9 %
Neutrophils Absolute: 2.8 10*3/uL (ref 1.4–7.0)
Neutrophils: 55 %
Platelets: 208 10*3/uL (ref 150–450)
RBC: 5.12 x10E6/uL (ref 3.77–5.28)
RDW: 16.9 % — ABNORMAL HIGH (ref 11.7–15.4)
WBC: 5.1 10*3/uL (ref 3.4–10.8)

## 2020-05-27 NOTE — Progress Notes (Signed)
Subjective:  Patient ID: [name], female    DOB: 1960/11/06  Age: 60 y.o. MRN: [id number]  CC: Medical Management of Chronic Issues   HPI [name] presents for  Physical exam with no gyn component.   Patient reports that she has been having some chest Coryell on occasion.  It is nonexertional.  Specifically it occurs with sitting.  It lasts 1 to 2 minutes.  It does radiate to the angle of the scapula and up to the jaw.  It is not associated with shortness of breath.  Patient's most consistent health issue is McArdle's syndrome.  She has constant aching in all of her muscles throughout her body.  Depression screen [facility] 2/9 05/27/2020 02/12/2019 06/19/2018  Decreased Interest 2 0 2  Down, Depressed, Hopeless 1 0 2  PHQ - 2 Score 3 0 4  Altered sleeping 1 - 2  Tired, decreased energy 3 - 2  Change in appetite 2 - 0  Feeling bad or failure about yourself  1 - 0  Trouble concentrating 0 - 0  Moving slowly or fidgety/restless 0 - 0  Suicidal thoughts 0 - 0  PHQ-9 Score 10 - 8  Difficult doing work/chores Somewhat difficult - -  Some recent data might be hidden    History [name] has a past medical history of Allergy, Anxiety, McArdle's disease ([place]), and McArdle's disease ([name]) (1985).   She has a past surgical history that includes Inner ear surgery and Cesarean section.   Her family history includes Cirrhosis in her brother and father; Depression in her brother and sister; Diabetes in her father and mother; Heart disease (age of onset: 36) in her mother.She reports that she quit smoking about 28 years ago. She has never used smokeless tobacco. She reports current alcohol use. She reports that she does not use drugs.    ROS Review of Systems  Constitutional: Negative for appetite change, chills, diaphoresis, fatigue, fever and unexpected weight change.  HENT: Negative for congestion, ear pain, hearing loss, postnasal drip, rhinorrhea, sneezing, sore throat and trouble  swallowing.   Eyes: Negative for pain.  Respiratory: Negative for cough, chest tightness and shortness of breath.   Cardiovascular: Positive for chest pain. Negative for palpitations.  Gastrointestinal: Positive for constipation. Negative for abdominal pain, diarrhea, nausea and vomiting.  Endocrine: Negative for cold intolerance, heat intolerance, polydipsia, polyphagia and polyuria.  Genitourinary: Negative for dysuria, frequency and menstrual problem.  Musculoskeletal: Positive for myalgias. Negative for arthralgias and joint swelling.  Skin: Negative for rash.  Allergic/Immunologic: Negative for environmental allergies.  Neurological: Negative for dizziness, weakness, numbness and headaches.  Psychiatric/Behavioral: Negative for agitation and dysphoric mood.    Objective:  BP 117/77   Pulse 81   Temp 97.9 F (36.6 C) (Temporal)   Ht '5\' 3"'  (1.6 m)   Wt 155 lb 12.8 oz (70.7 kg)   BMI 27.60 kg/m   BP Readings from Last 3 Encounters:  05/27/20 117/77  02/12/19 116/73  08/31/18 130/82    Wt Readings from Last 3 Encounters:  05/27/20 155 lb 12.8 oz (70.7 kg)  02/12/19 151 lb (68.5 kg)  08/31/18 155 lb 12.8 oz (70.7 kg)     Physical Exam Constitutional:      General: She is not in acute distress.    Appearance: She is well-developed and well-nourished.  HENT:     Head: Normocephalic and atraumatic.     Right Ear: External ear normal.     Left Ear: External ear normal.  Nose: Nose normal.     Mouth/Throat:     Mouth: Oropharynx is clear and moist.  Eyes:     Extraocular Movements: EOM normal.     Conjunctiva/sclera: Conjunctivae normal.     Pupils: Pupils are equal, round, and reactive to light.  Neck:     Thyroid: No thyromegaly.  Cardiovascular:     Rate and Rhythm: Normal rate and regular rhythm.     Heart sounds: Normal heart sounds. No murmur heard.   Pulmonary:     Effort: Pulmonary effort is normal. No respiratory distress.     Breath sounds: Normal  breath sounds. No wheezing or rales.  Abdominal:     General: Bowel sounds are normal. There is no distension.     Palpations: Abdomen is soft.     Tenderness: There is no abdominal tenderness.  Musculoskeletal:     Cervical back: Normal range of motion and neck supple.  Lymphadenopathy:     Cervical: No cervical adenopathy.  Skin:    General: Skin is warm and dry.  Neurological:     Mental Status: She is alert and oriented to person, place, and time.     Deep Tendon Reflexes: Reflexes are normal and symmetric.  Psychiatric:        Mood and Affect: Mood and affect normal.        Behavior: Behavior normal.        Thought Content: Thought content normal.        Judgment: Judgment normal.    EKG shows no acute ischemic  changes   Assessment & Plan:   [name] was seen today for medical management of chronic issues.  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 I am also having her maintain her buPROPion, ALPRAZolam, nitroGLYCERIN, Wheat Dextrin (BENEFIBER PO), Polyethylene Glycol 3350 (MIRALAX PO), escitalopram, and UNABLE TO FIND.  Allergies as of 05/27/2020       Reactions   Bactrim [sulfamethoxazole-trimethoprim]    Hydrocodone Hives      Medication List       Accurate as of May 27, 2020  8:03 PM. If you have any questions, ask your nurse or doctor.        STOP taking these medications   cephALEXin 500 MG capsule Commonly known as: KEFLEX Stopped by: Claretta Fraise, MD   fluconazole 150 MG tablet Commonly known as: DIFLUCAN  Stopped by: Claretta Fraise, MD   Premarin vaginal cream Generic drug: conjugated estrogens Stopped by: Claretta Fraise, MD     TAKE these medications   ALPRAZolam 0.5 MG tablet Commonly known as: XANAX Take 0.5 mg by mouth at bedtime as needed for sleep.   BENEFIBER PO Take by mouth every morning.   buPROPion 150 MG 12 hr tablet Commonly known as: WELLBUTRIN SR Take 150 mg by mouth daily.   escitalopram 20 MG tablet Commonly known as: LEXAPRO Take 1 tablet by mouth daily.   MIRALAX PO Take by mouth at bedtime.   nitroGLYCERIN 0.4 MG SL tablet Commonly known as: NITROSTAT Place 1 tablet (0.4 mg total) under the tongue every 5 (five) minutes as needed for chest pain.   UNABLE TO FIND Med Name:  CBD oil (ggts)        Follow-up: Return in about 6 months (around 11/24/2020).  Claretta Fraise, M.D.

## 2020-05-28 ENCOUNTER — Encounter: Payer: Self-pay | Admitting: Family Medicine

## 2020-05-28 LAB — CMP14+EGFR
ALT: 27 IU/L (ref 0–32)
AST: 32 IU/L (ref 0–40)
Albumin/Globulin Ratio: 1.9 (ref 1.2–2.2)
Albumin: 4.5 g/dL (ref 3.8–4.8)
Alkaline Phosphatase: 94 IU/L (ref 44–121)
BUN/Creatinine Ratio: 16 (ref 12–28)
BUN: 14 mg/dL (ref 8–27)
Bilirubin Total: 0.3 mg/dL (ref 0.0–1.2)
CO2: 24 mmol/L (ref 20–29)
Calcium: 9.6 mg/dL (ref 8.7–10.3)
Chloride: 102 mmol/L (ref 96–106)
Creatinine, Ser: 0.87 mg/dL (ref 0.57–1.00)
GFR calc Af Amer: 83 mL/min/{1.73_m2} (ref >59)
GFR calc non Af Amer: 72 mL/min/{1.73_m2} (ref >59)
Globulin, Total: 2.4 g/dL (ref 1.5–4.5)
Glucose: 92 mg/dL (ref 65–99)
Potassium: 4.4 mmol/L (ref 3.5–5.2)
Sodium: 142 mmol/L (ref 134–144)
Total Protein: 6.9 g/dL (ref 6.0–8.5)

## 2020-05-28 LAB — CK: Total CK: 524 U/L (ref 32–182)

## 2020-05-28 LAB — LIPID PANEL
Chol/HDL Ratio: 2.4 ratio (ref 0.0–4.4)
Cholesterol, Total: 173 mg/dL (ref 100–199)
HDL: 71 mg/dL (ref >39)
LDL Chol Calc (NIH): 82 mg/dL (ref 0–99)
Triglycerides: 116 mg/dL (ref 0–149)
VLDL Cholesterol Cal: 20 mg/dL (ref 5–40)

## 2020-05-28 LAB — VITAMIN D 25 HYDROXY (VIT D DEFICIENCY, FRACTURES): Vit D, 25-Hydroxy: 44.6 ng/mL (ref 30.0–100.0)

## 2020-06-03 ENCOUNTER — Other Ambulatory Visit: Payer: Self-pay | Admitting: Family Medicine

## 2020-06-03 ENCOUNTER — Telehealth: Payer: Self-pay

## 2020-06-03 DIAGNOSIS — E7404 McArdle disease: Secondary | ICD-10-CM

## 2020-06-03 NOTE — Telephone Encounter (Signed)
I have informed pt that we did not draw an A1C at the time of her visit. We did check her glucose and it was WNL. Pt made aware of this.  She would still like to have an A1C added to labs if possible.  Also, pt received a phone call from [facility] about her EKG and wanted her to follow up with them. Pt said that nothing was mentioned at her appt in regards to problems with her heart. Please advise on what to inform pt of

## 2020-06-03 NOTE — Telephone Encounter (Signed)
Pt made aware that the cardio referral is just routine and that there is no reason to be alarmed.  She will come in this week to check A1C

## 2020-06-03 NOTE — Telephone Encounter (Signed)
I ordered the A1c. Check with lab. She may have to have a finger stick for it.   Regarding cardiology, we discussed some symptoms she was having and I did an EKG. I felt that she would benefit from a cardiology evaluation. I told her I was considering it while she was here. When I went back over everything it seemed to be the prudent thing for her.On the other hand, there is no need to be alarmed. It is routine. WS

## 2020-06-23 ENCOUNTER — Ambulatory Visit: Payer: Self-pay

## 2020-06-29 ENCOUNTER — Encounter: Payer: Self-pay | Admitting: Cardiology

## 2020-06-29 DIAGNOSIS — R072 Precordial pain: Secondary | ICD-10-CM | POA: Insufficient documentation

## 2020-06-29 NOTE — Progress Notes (Unsigned)
Cardiology Office Note   Date:  06/30/2020   ID:  [name], DOB 1960/08/16, MRN [id number]  PCP:  Mechele Claude, MD  Cardiologist:   No primary care provider on file. Referring:  Mechele Claude, MD  Chief Complaint  Patient presents with  . Chest Pain      History of Present Illness: [name] is a 60 y.o. female who is referred by Mechele Claude, MD for evaluaiton of precordial chest pain.  I saw the patient in 2017 because of a family history and also because of chest discomfort.  She had a negative stress perfusion study at that time.  She said that more recently she has had a couple of episodes of chest discomfort.  This was vague and she has not had any in a while.  She only really can recall the details of one episode.  This was her upper chest.  It lasted for 1 to 2 minutes.  There was some discomfort in her jaw.  There was no associated nausea vomiting or diaphoresis.  It was mild.  There was no arm discomfort.  She is active going up and down stairs and she cannot bring this on.  She has never had this before.  It came and went spontaneously.  She otherwise does not describe associated symptoms such as shortness of breath, PND or orthopnea.  She does not have palpitations, presyncope or syncope.   Past Medical History:  Diagnosis Date  . Anxiety   . McArdle's disease [facility])     Past Surgical History:  Procedure Laterality Date  . CESAREAN SECTION    . INNER EAR SURGERY       Current Outpatient Medications  Medication Sig Dispense Refill  . ALPRAZolam (XANAX) 0.5 MG tablet Take 0.5 mg by mouth at bedtime as needed for sleep.    Marland Kitchen buPROPion (WELLBUTRIN SR) 150 MG 12 hr tablet Take 150 mg by mouth daily.    Marland Kitchen escitalopram (LEXAPRO) 20 MG tablet Take 1 tablet by mouth daily.    . nitroGLYCERIN (NITROSTAT) 0.4 MG SL tablet Place 1 tablet (0.4 mg total) under the tongue every 5 (five) minutes as needed for chest pain. 30 tablet 0  . Polyethylene Glycol 3350  (MIRALAX PO) Take by mouth at bedtime.    Marland Kitchen UNABLE TO FIND Med Name:  CBD oil (ggts)    . Wheat Dextrin (BENEFIBER PO) Take by mouth every morning.     No current facility-administered medications for this visit.    Allergies:   Bactrim [sulfamethoxazole-trimethoprim] and Hydrocodone    Social History:  The patient  reports that she quit smoking about 28 years ago. She has never used smokeless tobacco. She reports current alcohol use. She reports that she does not use drugs.   Family History:  The patient's family history includes Cirrhosis in her brother and father; Depression in her brother and sister; Diabetes in her father and mother; Heart disease (age of onset: 39) in her mother.    ROS:  Please see the history of present illness.   Otherwise, review of systems are positive for none.   All other systems are reviewed and negative.    PHYSICAL EXAM: VS:  BP 116/74   Pulse 80   Ht 5\' 3"  (1.6 m)   Wt 155 lb 6.4 oz (70.5 kg)   SpO2 98%   BMI 27.53 kg/m  , BMI Body mass index is 27.53 kg/m. GENERAL:  Well appearing HEENT:  Pupils equal round  and reactive, fundi not visualized, oral mucosa unremarkable NECK:  No jugular venous distention, waveform within normal limits, carotid upstroke brisk and symmetric, no bruits, no thyromegaly LYMPHATICS:  No cervical, inguinal adenopathy LUNGS:  Clear to auscultation bilaterally BACK:  No CVA tenderness CHEST:  Unremarkable HEART:  PMI not displaced or sustained,S1 and S2 within normal limits, no S3, no S4, no clicks, no rubs, no murmurs ABD:  Flat, positive bowel sounds normal in frequency in pitch, no bruits, no rebound, no guarding, no midline pulsatile mass, no hepatomegaly, no splenomegaly EXT:  2 plus pulses throughout, no edema, no cyanosis no clubbing SKIN:  No rashes no nodules NEURO:  Cranial nerves II through XII grossly intact, motor grossly intact throughout PSYCH:  Cognitively intact, oriented to person place and  time    EKG:  EKG is ordered today. The ekg ordered today demonstrates sinus rhythm, rate 80, axis within normal limits, intervals within normal limits, RSR 1 V1 and V2, no acute ST-T wave changes.   Recent Labs: 05/27/2020: ALT 27; BUN 14; Creatinine, Ser 0.87; Hemoglobin 14.1; Platelets 208; Potassium 4.4; Sodium 142    Lipid Panel    Component Value Date/Time   CHOL 173 05/27/2020 1111   TRIG 116 05/27/2020 1111   HDL 71 05/27/2020 1111   CHOLHDL 2.4 05/27/2020 1111   LDLCALC 82 05/27/2020 1111      Wt Readings from Last 3 Encounters:  06/30/20 155 lb 6.4 oz (70.5 kg)  05/27/20 155 lb 12.8 oz (70.7 kg)  02/12/19 151 lb (68.5 kg)      Other studies Reviewed: Additional studies/ records that were reviewed today include: Labs. Review of the above records demonstrates:  Please see elsewhere in the note.     ASSESSMENT AND PLAN:  PRECORDIAL CHEST PAIN:    This is likely nonanginal but she does have a family history.  I would like to screen her with a coronary calcium score.  Further evaluation will be based on these results.   Current medicines are reviewed at length with the patient today.  The patient does not have concerns regarding medicines.  The following changes have been made:  no change  Labs/ tests ordered today include:   Orders Placed This Encounter  Procedures  . CT CARDIAC SCORING (SELF PAY ONLY)  . EKG 12-Lead     Disposition:   FU with me as needed.      Signed, Rollene Rotunda, MD  06/30/2020 10:11 AM    [facility]

## 2020-06-30 ENCOUNTER — Ambulatory Visit: Payer: BC Managed Care – PPO | Admitting: Cardiology

## 2020-06-30 ENCOUNTER — Encounter: Payer: Self-pay | Admitting: Cardiology

## 2020-06-30 ENCOUNTER — Other Ambulatory Visit: Payer: Self-pay

## 2020-06-30 VITALS — BP 116/74 | HR 80 | Ht 63.0 in | Wt 155.4 lb

## 2020-06-30 DIAGNOSIS — R072 Precordial pain: Secondary | ICD-10-CM | POA: Diagnosis not present

## 2020-06-30 NOTE — Patient Instructions (Signed)
Medication Instructions:  No changes *If you need a refill on your cardiac medications before your next appointment, please call your pharmacy*  Testing/Procedures: Coronary calcium score  Follow-Up: At [facility], you and your health needs are our priority.  As part of our continuing mission to provide you with exceptional heart care, we have created designated Provider Care Teams.  These Care Teams include your primary Cardiologist (physician) and Advanced Practice Providers (APPs -  Physician Assistants and Nurse Practitioners) who all work together to provide you with the care you need, when you need it.  MyChart is used to connect with patients for Virtual Visits (Telemedicine).  Patients are able to view lab/test results, encounter notes, upcoming appointments, etc.  Non-urgent messages can be sent to your provider as well.      Your next appointment:   Follow up as needed  Other Instructions Dr. Antoine Poche has ordered a CT coronary calcium score. This test is done at [address]. This is $99 out of pocket.  Coronary CalciumScan A coronary calcium scan is an imaging test used to look for deposits of calcium and other fatty materials (plaques) in the inner lining of the blood vessels of the heart (coronary arteries). These deposits of calcium and plaques can partly clog and narrow the coronary arteries without producing any symptoms or warning signs. This puts a person at risk for a heart attack. This test can detect these deposits before symptoms develop. Tell a health care provider about:  Any allergies you have.  All medicines you are taking, including vitamins, herbs, eye drops, creams, and over-the-counter medicines.  Any problems you or family members have had with anesthetic medicines.  Any blood disorders you have.  Any surgeries you have had.  Any medical conditions you have.  Whether you are pregnant or may be pregnant. What are the  risks? Generally, this is a safe procedure. However, problems may occur, including:  Harm to a pregnant woman and her unborn baby. This test involves the use of radiation. Radiation exposure can be dangerous to a pregnant woman and her unborn baby. If you are pregnant, you generally should not have this procedure done.  Slight increase in the risk of cancer. This is because of the radiation involved in the test. What happens before the procedure? No preparation is needed for this procedure. What happens during the procedure?  You will undress and remove any jewelry around your neck or chest.  You will put on a hospital gown.  Sticky electrodes will be placed on your chest. The electrodes will be connected to an electrocardiogram (ECG) machine to record a tracing of the electrical activity of your heart.  A CT scanner will take pictures of your heart. During this time, you will be asked to lie still and hold your breath for 2-3 seconds while a picture of your heart is being taken. The procedure may vary among health care providers and hospitals. What happens after the procedure?  You can get dressed.  You can return to your normal activities.  It is up to you to get the results of your test. Ask your health care provider, or the department that is doing the test, when your results will be ready. Summary  A coronary calcium scan is an imaging test used to look for deposits of calcium and other fatty materials (plaques) in the inner lining of the blood vessels of the heart (coronary arteries).  Generally, this is a safe procedure.  Tell your health care provider if you are pregnant or may be pregnant.  No preparation is needed for this procedure.  A CT scanner will take pictures of your heart.  You can return to your normal activities after the scan is done. This information is not intended to replace advice given to you by your health care provider. Make sure you discuss any  questions you have with your health care provider. Document Released: 10/09/2007 Document Revised: 03/01/2016 Document Reviewed: 03/01/2016 Elsevier Interactive Patient Education  2017 [employer].

## 2020-07-21 ENCOUNTER — Ambulatory Visit (INDEPENDENT_AMBULATORY_CARE_PROVIDER_SITE_OTHER): Payer: BC Managed Care – PPO | Admitting: Psychology

## 2020-07-21 DIAGNOSIS — F331 Major depressive disorder, recurrent, moderate: Secondary | ICD-10-CM | POA: Diagnosis not present

## 2020-07-28 ENCOUNTER — Other Ambulatory Visit: Payer: Self-pay

## 2020-07-28 ENCOUNTER — Ambulatory Visit (INDEPENDENT_AMBULATORY_CARE_PROVIDER_SITE_OTHER)
Admission: RE | Admit: 2020-07-28 | Discharge: 2020-07-28 | Disposition: A | Discharge status: Home / Self Care | Payer: Self-pay | Source: Ambulatory Visit | Attending: Cardiology | Admitting: Cardiology

## 2020-07-28 DIAGNOSIS — R072 Precordial pain: Secondary | ICD-10-CM

## 2020-08-07 ENCOUNTER — Ambulatory Visit: Payer: BC Managed Care – PPO | Admitting: Family Medicine

## 2020-08-07 ENCOUNTER — Encounter: Payer: Self-pay | Admitting: Family Medicine

## 2020-08-07 ENCOUNTER — Other Ambulatory Visit: Payer: Self-pay

## 2020-08-07 ENCOUNTER — Ambulatory Visit (HOSPITAL_COMMUNITY)
Admission: RE | Admit: 2020-08-07 | Discharge: 2020-08-07 | Disposition: A | Discharge status: Home / Self Care | Payer: BC Managed Care – PPO | Source: Ambulatory Visit | Attending: Family Medicine | Admitting: Family Medicine

## 2020-08-07 DIAGNOSIS — R109 Unspecified abdominal pain: Secondary | ICD-10-CM

## 2020-08-07 NOTE — Progress Notes (Signed)
Virtual Visit via Telephone Note  I connected with [name] on 08/07/20 at 5:16 PM by telephone and verified that I am speaking with the correct person using two identifiers. [name] is currently located at work and nobody is currently with her during this visit. The provider, Gwenlyn Fudge, FNP is located in their office at time of visit.  I discussed the limitations, risks, security and privacy concerns of performing an evaluation and management service by telephone and the availability of in person appointments. I also discussed with the patient that there may be a patient responsible charge related to this service. The patient expressed understanding and agreed to proceed.  Subjective: PCP: Mechele Claude, MD  Chief Complaint  Patient presents with  . Urinary Tract Infection   Patient reports this morning she started having pain in the left side of her low back that wraps around her left side and goes down to her groin.  She reports the pain is pretty significant and sometimes brings her to tears and causes nausea.  It is worse when she has been standing.  She reports there was blood on the toilet paper after urinating yesterday; she has not for sure whether it was from her urine or vaginal bleeding.  She has already gone through menopause.  She does report very mild burning with urination occasionally that resolves when she drinks a lot of water.   ROS: Per HPI  Current Outpatient Medications:  .  ALPRAZolam (XANAX) 0.5 MG tablet, Take 0.5 mg by mouth at bedtime as needed for sleep., Disp: , Rfl:  .  buPROPion (WELLBUTRIN SR) 150 MG 12 hr tablet, Take 150 mg by mouth daily., Disp: , Rfl:  .  escitalopram (LEXAPRO) 20 MG tablet, Take 1 tablet by mouth daily., Disp: , Rfl:  .  nitroGLYCERIN (NITROSTAT) 0.4 MG SL tablet, Place 1 tablet (0.4 mg total) under the tongue every 5 (five) minutes as needed for chest pain., Disp: 30 tablet, Rfl: 0 .  Polyethylene Glycol 3350  (MIRALAX PO), Take by mouth at bedtime., Disp: , Rfl:  .  UNABLE TO FIND, Med Name:  CBD oil (ggts), Disp: , Rfl:  .  Wheat Dextrin (BENEFIBER PO), Take by mouth every morning., Disp: , Rfl:   Allergies  Allergen Reactions  . Bactrim [Sulfamethoxazole-Trimethoprim]   . Hydrocodone Hives   Past Medical History:  Diagnosis Date  . Anxiety   . McArdle's disease (HCC)     Observations/Objective: A&O  No respiratory distress or wheezing audible over the phone Mood, judgement, and thought processes all WNL   Assessment and Plan: 1. Left flank pain It sounds like patient is describing a kidney stone.  KUB ordered to assess. - DG Abd 1 View; Future   Follow Up Instructions:  I discussed the assessment and treatment plan with the patient. The patient was provided an opportunity to ask questions and all were answered. The patient agreed with the plan and demonstrated an understanding of the instructions.   The patient was advised to call back or seek an in-person evaluation if the symptoms worsen or if the condition fails to improve as anticipated.  The above assessment and management plan was discussed with the patient. The patient verbalized understanding of and has agreed to the management plan. Patient is aware to call the clinic if symptoms persist or worsen. Patient is aware when to return to the clinic for a follow-up visit. Patient educated on when it is appropriate to go to  the emergency department.   Time call ended: 5:27 PM  I provided 11 minutes of non-face-to-face time during this encounter.  Deliah Boston, MSN, APRN, FNP-C [facility] 08/07/20

## 2020-08-08 ENCOUNTER — Encounter: Payer: Self-pay | Admitting: Family Medicine

## 2020-08-11 ENCOUNTER — Ambulatory Visit
Admission: RE | Admit: 2020-08-11 | Discharge: 2020-08-11 | Disposition: A | Discharge status: Home / Self Care | Payer: BC Managed Care – PPO | Source: Ambulatory Visit | Attending: Obstetrics and Gynecology | Admitting: Obstetrics and Gynecology

## 2020-08-11 ENCOUNTER — Other Ambulatory Visit: Payer: Self-pay

## 2020-08-11 DIAGNOSIS — Z1231 Encounter for screening mammogram for malignant neoplasm of breast: Secondary | ICD-10-CM

## 2020-08-11 NOTE — Telephone Encounter (Signed)
Alona Bene off can you advise for patient?

## 2020-08-12 ENCOUNTER — Other Ambulatory Visit: Payer: Self-pay | Admitting: Family Medicine

## 2020-08-12 DIAGNOSIS — R109 Unspecified abdominal pain: Secondary | ICD-10-CM

## 2020-08-13 ENCOUNTER — Other Ambulatory Visit: Payer: Self-pay

## 2020-08-13 ENCOUNTER — Other Ambulatory Visit: Payer: BC Managed Care – PPO

## 2020-08-13 DIAGNOSIS — R109 Unspecified abdominal pain: Secondary | ICD-10-CM

## 2020-08-13 LAB — MICROSCOPIC EXAMINATION

## 2020-08-13 LAB — URINALYSIS, ROUTINE W REFLEX MICROSCOPIC
Bilirubin, UA: NEGATIVE
Glucose, UA: NEGATIVE
Ketones, UA: NEGATIVE
Leukocytes,UA: NEGATIVE
Nitrite, UA: NEGATIVE
Protein,UA: NEGATIVE
Specific Gravity, UA: 1.025 (ref 1.005–1.030)
Urobilinogen, Ur: 0.2 mg/dL (ref 0.2–1.0)
pH, UA: 5 (ref 5.0–7.5)

## 2020-09-01 ENCOUNTER — Ambulatory Visit (INDEPENDENT_AMBULATORY_CARE_PROVIDER_SITE_OTHER): Payer: BC Managed Care – PPO | Admitting: Psychology

## 2020-09-01 DIAGNOSIS — F331 Major depressive disorder, recurrent, moderate: Secondary | ICD-10-CM

## 2021-01-12 ENCOUNTER — Encounter: Payer: Self-pay | Admitting: Family

## 2021-01-12 ENCOUNTER — Other Ambulatory Visit: Payer: Self-pay

## 2021-01-12 ENCOUNTER — Ambulatory Visit: Payer: BC Managed Care – PPO | Admitting: Family

## 2021-01-12 ENCOUNTER — Ambulatory Visit (INDEPENDENT_AMBULATORY_CARE_PROVIDER_SITE_OTHER): Payer: BC Managed Care – PPO

## 2021-01-12 VITALS — BP 117/72 | HR 79 | Temp 97.5°F | Ht 63.0 in | Wt 151.8 lb

## 2021-01-12 DIAGNOSIS — L989 Disorder of the skin and subcutaneous tissue, unspecified: Secondary | ICD-10-CM | POA: Diagnosis not present

## 2021-01-12 DIAGNOSIS — M533 Sacrococcygeal disorders, not elsewhere classified: Secondary | ICD-10-CM | POA: Diagnosis not present

## 2021-01-12 DIAGNOSIS — R300 Dysuria: Secondary | ICD-10-CM | POA: Diagnosis not present

## 2021-01-12 LAB — MICROSCOPIC EXAMINATION: Renal Epithel, UA: NONE SEEN /hpf

## 2021-01-12 LAB — URINALYSIS, COMPLETE
Bilirubin, UA: NEGATIVE
Glucose, UA: NEGATIVE
Ketones, UA: NEGATIVE
Leukocytes,UA: NEGATIVE
Nitrite, UA: NEGATIVE
Protein,UA: NEGATIVE
Specific Gravity, UA: 1.02 (ref 1.005–1.030)
Urobilinogen, Ur: 0.2 mg/dL (ref 0.2–1.0)
pH, UA: 5 (ref 5.0–7.5)

## 2021-01-12 MED ORDER — DICLOFENAC SODIUM 75 MG PO TBEC: 75.0000 mg | DELAYED_RELEASE_TABLET | Freq: Two times a day (BID) | ORAL | 0 refills | Status: DC

## 2021-01-12 NOTE — Progress Notes (Signed)
Subjective:    Patient ID: [name], female    DOB: 11-04-1960, 60 y.o.   MRN: [id number]  Chief Complaint  Patient presents with   Dysuria   spot on face    Been there 2 mths growing in size    Tailbone Pain    Larey Seat over a year ago and she hurt it then never went to the doctor it has started hurting more on the right side    PT presents to the office today with multiple complaints. She report she has had intermittent dysuria. She states this has been chronic, but over the last week it has been worse.   She reports she fell last year on some snow on her buttocks. She states it resolved, but two months ago the pain returned. She has sharp "poking" pain of 8 out 10 when sitting. She has not taken any OTC medications.   She has noticed a skin lesion on her right cheek two months ago. She thought it was acne, but never went away. Reports the area itches and has become larger.  Dysuria  This is a chronic problem. The problem occurs intermittently. The problem has been waxing and waning. The quality of the pain is described as burning. The pain is at a severity of 4/10. The pain is mild. There has been no fever. Pertinent negatives include no discharge, flank pain, frequency, hematuria, hesitancy or urgency. She has tried increased fluids for the symptoms. The treatment provided mild relief.     Review of Systems  Genitourinary:  Positive for dysuria. Negative for flank pain, frequency, hematuria, hesitancy and urgency.  All other systems reviewed and are negative.     Objective:   Physical Exam Vitals reviewed.  Constitutional:      General: She is not in acute distress.    Appearance: She is well-developed.  HENT:     Head: Normocephalic and atraumatic.     Right Ear: Tympanic membrane normal.     Left Ear: Tympanic membrane normal.  Eyes:     Pupils: Pupils are equal, round, and reactive to light.  Neck:     Thyroid: No thyromegaly.  Cardiovascular:     Rate and  Rhythm: Normal rate and regular rhythm.     Heart sounds: Normal heart sounds. No murmur heard. Pulmonary:     Effort: Pulmonary effort is normal. No respiratory distress.     Breath sounds: Normal breath sounds. No wheezing.  Abdominal:     General: Bowel sounds are normal. There is no distension.     Palpations: Abdomen is soft.     Tenderness: There is no abdominal tenderness.  Musculoskeletal:        General: No tenderness. Normal range of motion.     Cervical back: Normal range of motion and neck supple.  Skin:    General: Skin is warm and dry.          Comments: Flesh color skin lesion on right cheek that is approx 0.6X0.5 cm  Neurological:     Mental Status: She is alert and oriented to person, place, and time.     Cranial Nerves: No cranial nerve deficit.     Deep Tendon Reflexes: Reflexes are normal and symmetric.  Psychiatric:        Behavior: Behavior normal.        Thought Content: Thought content normal.        Judgment: Judgment normal.      BP 117/72  Pulse 79   Temp (!) 97.5 F (36.4 C) (Temporal)   Ht 5\' 3"  (1.6 m)   Wt 151 lb 12.8 oz (68.9 kg)   BMI 26.89 kg/m      Assessment & Plan:  [name] comes in today with chief complaint of Dysuria, spot on face (Been there 2 mths growing in size ), and Tailbone Pain (Fell over a year ago and she hurt it then never went to the doctor it has started hurting more on the right side )   Diagnosis and orders addressed:  1. Dysuria - Negative for UTI - Urine Culture - Urinalysis, Complete  2. Coccyx pain Start diclofenac BID with food No other NSAIDs Use donut pillow - DG Sacrum/Coccyx; Future - diclofenac (VOLTAREN) 75 MG EC tablet; Take 1 tablet (75 mg total) by mouth 2 (two) times daily.  Dispense: 30 tablet; Refill: 0  3. Skin lesion of face - Ambulatory referral to Dermatology     Claire Odea, FNP

## 2021-01-12 NOTE — Patient Instructions (Signed)
Tailbone Injury °The tailbone (coccyx) is the small bone at the lower end of the spine. A tailbone injury may involve stretched ligaments, bruising, or a broken bone (fracture). Tailbone injuries can be painful, and some may take a long time to heal. °What are the causes? °This condition may be caused by: °Falling and landing on the tailbone. °Repeated strain or friction from sitting for long periods of time. This may include actions such as rowing and bicycling. °Childbirth. °In some cases, the cause may not be known. °What are the signs or symptoms? °Symptoms of this condition include: °Pain in the tailbone area or lower back, especially when sitting. °Pain or difficulty when standing up from a sitting position. °Bruising or swelling in the tailbone area. °Painful bowel movements. °In women, pain during intercourse. °How is this diagnosed? °This condition may be diagnosed based on: °Your symptoms. °A physical exam. °If your health care provider suspects a fracture, you may have additional tests, such as: °X-rays. °CT scan. °MRI. °How is this treated? °Most tailbone injuries heal on their own in 4-6 weeks. However, recovery time may be longer if the injury involves a fracture. °Treatment for this condition may include: °NSAIDs or other over-the-counter medicines to help relieve your pain. °Using a large, rubber or inflated ring or cushion to take pressure off the tailbone when sitting. °Physical therapy. °Injecting the tailbone area with local anesthesia and steroid medicine. This is not normally needed unless the pain does not improve over time with over-the-counter pain medicines. °Follow these instructions at home: °Activity °Avoid sitting for long periods of time. °To prevent repeating an injury that is caused by strain or friction: °Wear appropriate padding and sports gear when bicycling and rowing. °Increase your activity as the pain allows. Perform any exercises that are recommended by your health care  provider or physical therapist. °Managing pain, stiffness, and swelling °To help decrease discomfort when sitting: °Sit on your rubber or inflated ring or cushion as told by your health care provider. °Lean forward when you sit. °If directed, apply ice to the injured area: °Put ice in a plastic bag. °Place a towel between your skin and the bag. °Leave the ice on for 20 minutes, 2-3 times per day for the first 1-2 days. °If directed, apply heat to the affected area as often as told by your health care provider. Use the heat source that your health care provider recommends, such as a moist heat pack or a heating pad. °Place a towel between your skin and the heat source. °Leave the heat on for 20-30 minutes. °Remove the heat if your skin turns bright red. This is especially important if you are unable to feel pain, heat, or cold. You may have a greater risk of getting burned. °General instructions °Take over-the-counter and prescription medicines only as told by your health care provider. °To prevent or treat constipation or painful bowel movements, your health care provider may recommend that you: °Drink enough fluid to keep your urine pale yellow. °Eat foods that are high in fiber, such as fresh fruits and vegetables, whole grains, and beans. °Limit foods that are high in fat and processed sugars, such as fried and sweet foods. °Take an over-the-counter or prescription medicine for constipation. °Keep all follow-up visits as directed by your health care provider. This is important. °Contact a health care provider if: °Your pain becomes worse or is not controlled with medicine. °Your bowel movements cause a great deal of discomfort. °You are unable to have a   bowel movement after 4 days. °You have pain during intercourse. °Summary °A tailbone injury may involve stretched ligaments, bruising, or a broken bone (fracture). °Tailbone injuries can be painful. Most heal on their own in 4-6 weeks. °Treatment may include  taking NSAIDs, using a rubber or inflated ring or cushion when sitting, and physical therapy. °Follow any recommendations from your health care provider to prevent or treat constipation. °This information is not intended to replace advice given to you by your health care provider. Make sure you discuss any questions you have with your health care provider. °Document Revised: 05/10/2017 Document Reviewed: 05/10/2017 °Elsevier Patient Education © 2022 [employer] ° °

## 2021-01-15 LAB — URINE CULTURE: Organism ID, Bacteria: NO GROWTH

## 2021-08-31 DIAGNOSIS — Z0289 Encounter for other administrative examinations: Secondary | ICD-10-CM

## 2021-10-21 ENCOUNTER — Other Ambulatory Visit: Payer: Self-pay | Admitting: Obstetrics and Gynecology

## 2021-10-21 DIAGNOSIS — Z1231 Encounter for screening mammogram for malignant neoplasm of breast: Secondary | ICD-10-CM

## 2021-11-02 ENCOUNTER — Ambulatory Visit
Admission: RE | Admit: 2021-11-02 | Discharge: 2021-11-02 | Disposition: A | Discharge status: Home / Self Care | Payer: BC Managed Care – PPO | Source: Ambulatory Visit | Attending: Obstetrics and Gynecology | Admitting: Obstetrics and Gynecology

## 2021-11-02 DIAGNOSIS — Z1231 Encounter for screening mammogram for malignant neoplasm of breast: Secondary | ICD-10-CM

## 2021-12-21 ENCOUNTER — Telehealth: Payer: Self-pay | Admitting: Family Medicine

## 2021-12-21 ENCOUNTER — Other Ambulatory Visit: Payer: Self-pay | Admitting: *Deleted

## 2021-12-21 DIAGNOSIS — Z0000 Encounter for general adult medical examination without abnormal findings: Secondary | ICD-10-CM

## 2021-12-21 NOTE — Telephone Encounter (Signed)
done

## 2022-01-14 ENCOUNTER — Other Ambulatory Visit: Payer: BC Managed Care – PPO

## 2022-01-14 DIAGNOSIS — Z0000 Encounter for general adult medical examination without abnormal findings: Secondary | ICD-10-CM

## 2022-01-15 LAB — CBC WITH DIFFERENTIAL/PLATELET
Basophils Absolute: 0 10*3/uL (ref 0.0–0.2)
Basos: 1 %
EOS (ABSOLUTE): 0.2 10*3/uL (ref 0.0–0.4)
Eos: 4 %
Hematocrit: 44.1 % (ref 34.0–46.6)
Hemoglobin: 14.7 g/dL (ref 11.1–15.9)
Immature Grans (Abs): 0 10*3/uL (ref 0.0–0.1)
Immature Granulocytes: 0 %
Lymphocytes Absolute: 1.6 10*3/uL (ref 0.7–3.1)
Lymphs: 37 %
MCH: 31.2 pg (ref 26.6–33.0)
MCHC: 33.3 g/dL (ref 31.5–35.7)
MCV: 94 fL (ref 79–97)
Monocytes Absolute: 0.5 10*3/uL (ref 0.1–0.9)
Monocytes: 11 %
Neutrophils Absolute: 2 10*3/uL (ref 1.4–7.0)
Neutrophils: 47 %
Platelets: 210 10*3/uL (ref 150–450)
RBC: 4.71 x10E6/uL (ref 3.77–5.28)
RDW: 12.8 % (ref 11.7–15.4)
WBC: 4.3 10*3/uL (ref 3.4–10.8)

## 2022-01-15 LAB — CMP14+EGFR
ALT: 47 IU/L — ABNORMAL HIGH (ref 0–32)
AST: 51 IU/L — ABNORMAL HIGH (ref 0–40)
Albumin/Globulin Ratio: 2.2 (ref 1.2–2.2)
Albumin: 4.4 g/dL (ref 3.9–4.9)
Alkaline Phosphatase: 64 IU/L (ref 44–121)
BUN/Creatinine Ratio: 14 (ref 12–28)
BUN: 15 mg/dL (ref 8–27)
Bilirubin Total: 0.4 mg/dL (ref 0.0–1.2)
CO2: 26 mmol/L (ref 20–29)
Calcium: 9.4 mg/dL (ref 8.7–10.3)
Chloride: 102 mmol/L (ref 96–106)
Creatinine, Ser: 1.04 mg/dL — ABNORMAL HIGH (ref 0.57–1.00)
Globulin, Total: 2 g/dL (ref 1.5–4.5)
Glucose: 136 mg/dL — ABNORMAL HIGH (ref 70–99)
Potassium: 4.5 mmol/L (ref 3.5–5.2)
Sodium: 139 mmol/L (ref 134–144)
Total Protein: 6.4 g/dL (ref 6.0–8.5)
eGFR: 61 mL/min/{1.73_m2} (ref >59)

## 2022-01-15 LAB — LIPID PANEL
Chol/HDL Ratio: 2.5 ratio (ref 0.0–4.4)
Cholesterol, Total: 160 mg/dL (ref 100–199)
HDL: 63 mg/dL (ref >39)
LDL Chol Calc (NIH): 77 mg/dL (ref 0–99)
Triglycerides: 115 mg/dL (ref 0–149)
VLDL Cholesterol Cal: 20 mg/dL (ref 5–40)

## 2022-01-15 LAB — VITAMIN D 25 HYDROXY (VIT D DEFICIENCY, FRACTURES): Vit D, 25-Hydroxy: 69.2 ng/mL (ref 30.0–100.0)

## 2022-01-18 ENCOUNTER — Encounter: Payer: Self-pay | Admitting: Family Medicine

## 2022-01-18 ENCOUNTER — Ambulatory Visit (INDEPENDENT_AMBULATORY_CARE_PROVIDER_SITE_OTHER): Payer: BC Managed Care – PPO | Admitting: Family Medicine

## 2022-01-18 VITALS — BP 118/79 | HR 92 | Temp 98.7°F | Ht 63.0 in | Wt 151.0 lb

## 2022-01-18 DIAGNOSIS — E7404 McArdle disease: Secondary | ICD-10-CM

## 2022-01-18 DIAGNOSIS — Z0001 Encounter for general adult medical examination with abnormal findings: Secondary | ICD-10-CM | POA: Diagnosis not present

## 2022-01-18 DIAGNOSIS — Z0000 Encounter for general adult medical examination without abnormal findings: Secondary | ICD-10-CM

## 2022-01-18 DIAGNOSIS — Z23 Encounter for immunization: Secondary | ICD-10-CM | POA: Diagnosis not present

## 2022-01-18 NOTE — Progress Notes (Signed)
Subjective:  Patient ID: [name], female    DOB: 1960-09-16  Age: 61 y.o. MRN: [id number]  CC: Annual Exam   HPI [name] presents for Annual exam. No GYN.. Concerned that McArdle's may be causing kidney problems . Having some right flank discomfort. INtermittent. Onset a few weeks ago.     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   [name] has a past surgical history that includes Inner ear surgery and Cesarean section.   [name] family history includes Cirrhosis in [name] brother and father; Depression in [name] brother and sister; Diabetes in [name] father and mother; Heart disease (age of onset: 51) in [name] mother.[name] reports that [name] quit smoking about 29 years ago. [name] smoking use included cigarettes. [name] has never used smokeless tobacco. [name] reports current alcohol use. [name] reports that [name] does not use drugs.    ROS Review of Systems  Constitutional: Negative.   HENT:  Negative for congestion.   Eyes:  Negative for visual disturbance.  Respiratory:  Negative for shortness of breath.   Cardiovascular:  Negative for chest pain.  Gastrointestinal:  Negative for abdominal pain, constipation, diarrhea, nausea and vomiting.  Genitourinary:  Negative for difficulty urinating.  Musculoskeletal:  Negative for arthralgias and myalgias.  Neurological:  Negative for headaches.  Psychiatric/Behavioral:  Negative for sleep disturbance.     Objective:  BP 118/79    Pulse 92   Temp 98.7 F (37.1 C)   Ht 5\' 3"  (1.6 m)   Wt 151 lb (68.5 kg)   SpO2 97%   BMI 26.75 kg/m   BP Readings from Last 3 Encounters:  01/18/22 118/79  01/12/21 117/72  06/30/20 116/74    Wt Readings from Last 3 Encounters:  01/18/22 151 lb (68.5 kg)  01/12/21 151 lb 12.8 oz (68.9 kg)  06/30/20 155 lb 6.4 oz (70.5 kg)     Physical Exam Constitutional:      General: [name] is not in acute distress.    Appearance: [name] is well-developed.  HENT:     Head: Normocephalic and atraumatic.  Eyes:     Conjunctiva/sclera: Conjunctivae normal.     Pupils: Pupils are equal, round, and reactive to light.  Neck:     Thyroid: No thyromegaly.  Cardiovascular:     Rate and Rhythm: Normal rate and regular rhythm.     Heart sounds: Normal heart sounds. No murmur heard. Pulmonary:     Effort: Pulmonary effort is normal. No respiratory distress.     Breath sounds: Normal breath sounds. No wheezing or rales.  Abdominal:     General: Bowel sounds are normal. There is no distension.     Palpations: Abdomen is soft.     Tenderness: There is no abdominal tenderness.  Musculoskeletal:        General: Normal  range of motion.     Cervical back: Normal range of motion and neck supple.  Lymphadenopathy:     Cervical: No cervical adenopathy.  Skin:    General: Skin is warm and dry.  Neurological:     Mental Status: [name] is alert and oriented to person, place, and time.  Psychiatric:        Behavior: Behavior normal.        Thought Content: Thought content normal.        Judgment: Judgment normal.       Assessment & Plan:   [name] was seen today for annual exam.  Diagnoses and all orders for this visit:  McArdle disease ([place]) -     CK -     Myoglobin, urine  Well adult exam  Need for immunization against influenza -     Flu Vaccine QUAD 40mo+IM (Fluarix, Fluzone & Alfiuria Quad PF)       I have discontinued Kanita Marteney's nitroGLYCERIN, Wheat Dextrin (BENEFIBER PO),  and diclofenac. I am also having [name] maintain [name] ALPRAZolam, Polyethylene Glycol 3350 (MIRALAX PO), escitalopram, UNABLE TO FIND, and buPROPion.  Allergies as of 01/18/2022       Reactions   Bactrim [sulfamethoxazole-trimethoprim]    Hydrocodone Hives        Medication List        Accurate as of January 18, 2022  6:41 PM. If you have any questions, ask your nurse or doctor.          STOP taking these medications    BENEFIBER PO Stopped by: Claretta Fraise, MD   diclofenac 75 MG EC tablet Commonly known as: VOLTAREN Stopped by: Claretta Fraise, MD   nitroGLYCERIN 0.4 MG SL tablet Commonly known as: NITROSTAT Stopped by: Claretta Fraise, MD       TAKE these medications    ALPRAZolam 0.5 MG tablet Commonly known as: XANAX Take 0.5 mg by mouth at bedtime as needed for sleep.   buPROPion 300 MG 24 hr tablet Commonly known as: WELLBUTRIN XL Take 300 mg by mouth daily. What changed: Another medication with the same name was removed. Continue taking this medication, and follow the directions you see here. Changed by: Claretta Fraise, MD   escitalopram 20 MG tablet Commonly known as: LEXAPRO Take 1 tablet by mouth daily.   MIRALAX PO Take by mouth at bedtime.   UNABLE TO FIND Med Name:  CBD oil (ggts)         Follow-up: Return in about 6 months (around 07/19/2022).  Claretta Fraise, M.D.

## 2022-01-20 LAB — CK: Total CK: 1321 U/L (ref 32–182)

## 2022-01-20 LAB — SPECIMEN STATUS REPORT

## 2022-03-04 ENCOUNTER — Ambulatory Visit: Payer: BC Managed Care – PPO | Admitting: Family Medicine

## 2022-03-04 ENCOUNTER — Ambulatory Visit (INDEPENDENT_AMBULATORY_CARE_PROVIDER_SITE_OTHER): Payer: BC Managed Care – PPO

## 2022-03-04 ENCOUNTER — Encounter: Payer: Self-pay | Admitting: Family Medicine

## 2022-03-04 VITALS — BP 136/86 | HR 97 | Temp 98.6°F | Ht 63.0 in | Wt 146.0 lb

## 2022-03-04 DIAGNOSIS — R0989 Other specified symptoms and signs involving the circulatory and respiratory systems: Secondary | ICD-10-CM | POA: Diagnosis not present

## 2022-03-04 DIAGNOSIS — J40 Bronchitis, not specified as acute or chronic: Secondary | ICD-10-CM | POA: Diagnosis not present

## 2022-03-04 DIAGNOSIS — N952 Postmenopausal atrophic vaginitis: Secondary | ICD-10-CM | POA: Insufficient documentation

## 2022-03-04 DIAGNOSIS — R062 Wheezing: Secondary | ICD-10-CM | POA: Diagnosis not present

## 2022-03-04 DIAGNOSIS — R1030 Lower abdominal pain, unspecified: Secondary | ICD-10-CM | POA: Insufficient documentation

## 2022-03-04 DIAGNOSIS — M8580 Other specified disorders of bone density and structure, unspecified site: Secondary | ICD-10-CM | POA: Insufficient documentation

## 2022-03-04 MED ORDER — AMOXICILLIN-POT CLAVULANATE 875-125 MG PO TABS: 1.0000 | ORAL_TABLET | Freq: Two times a day (BID) | ORAL | 0 refills | Status: DC

## 2022-03-04 NOTE — Progress Notes (Signed)
BP 136/86   Pulse 97   Temp 98.6 F (37 C)   Ht 5\' 3"  (1.6 m)   Wt 146 lb (66.2 kg)   SpO2 96%   BMI 25.86 kg/m    Subjective:   Patient ID: [name], female    DOB: 1960/10/14, 61 y.o.   MRN: [id number]  HPI: [name] is a 61 y.o. female presenting on 03/04/2022 for Cough (Dry cough), Nasal Congestion (Started Tuesday - can't get anything up ), Wheezing (Started this week), and Shortness of Breath (Started around Tuesday )  Cough  Patient reports throat soreness since 02/27/2022 (four days) that has developed into a cough that is now increasing in severity. Reports dizziness, abdominal pressure with cough. Denies fevers or night sweats. Reports that she has been taking Mucinex with minimal improvement to symptoms. She has had pneumonia in the past and is concerned that she may have this again.   Relevant past medical, surgical, family and social history reviewed and updated as indicated. Interim medical history since our last visit reviewed. Allergies and medications reviewed and updated.  Review of Systems  Constitutional:  Positive for diaphoresis and fatigue. Negative for activity change, appetite change, chills and fever.  HENT:  Positive for congestion, postnasal drip, rhinorrhea, sinus pressure, sinus pain and sore throat. Negative for dental problem, facial swelling and hearing loss.   Eyes: Negative.   Respiratory:  Positive for cough and wheezing. Negative for chest tightness, shortness of breath and stridor.   Cardiovascular:  Negative for chest pain, palpitations and leg swelling.  Gastrointestinal:  Positive for abdominal pain. Negative for abdominal distention, blood in stool, constipation, diarrhea, nausea and vomiting.  Genitourinary: Negative.     Per HPI unless specifically indicated above   Allergies as of 03/04/2022       Reactions   Bactrim [sulfamethoxazole-trimethoprim]    Hydrocodone Hives        Medication List        Accurate as  of March 04, 2022 11:59 PM. If you have any questions, ask your nurse or doctor.          STOP taking these medications    UNABLE TO FIND Stopped by: Elige Radon Nickolas Chalfin, MD       TAKE these medications    ALPRAZolam 0.5 MG tablet Commonly known as: XANAX Take 0.5 mg by mouth at bedtime as needed for sleep.   amoxicillin-clavulanate 875-125 MG tablet Commonly known as: AUGMENTIN Take 1 tablet by mouth 2 (two) times daily. Started by: Elige Radon Kadan Millstein, MD   buPROPion 300 MG 24 hr tablet Commonly known as: WELLBUTRIN XL Take 300 mg by mouth daily.   escitalopram 20 MG tablet Commonly known as: LEXAPRO Take 1 tablet by mouth daily.   MIRALAX PO Take by mouth at bedtime.        Objective:   BP 136/86   Pulse 97   Temp 98.6 F (37 C)   Ht 5\' 3"  (1.6 m)   Wt 146 lb (66.2 kg)   SpO2 96%   BMI 25.86 kg/m   Wt Readings from Last 3 Encounters:  03/04/22 146 lb (66.2 kg)  01/18/22 151 lb (68.5 kg)  01/12/21 151 lb 12.8 oz (68.9 kg)    Physical Exam  Physical Examination:  General: Awake, tired, well nourished, No acute distress HEENT: Normal    Neck: No masses palpated. No lymphadenopathy    Ears: Tympanic membranes intact, normal light reflex, no erythema, no bulging  Eyes: PERRLA, extraocular membranes intact, sclera white    Nose: nasal turbinates moist, no nasal discharge    Throat: moist mucus membranes, no erythema, no tonsillar exudate.  Airway is patent Cardio: regular rate and rhythm, S1S2 heard, no murmurs appreciated Pulm: crackles noted in left lower lobe. no wheezes, rhonchi or rales; normal work of breathing on room air GI: soft, non-tender, non-distended, bowel sounds present x4, no hepatomegaly, no splenomegaly, no masses Extremities: warm, well perfused, No edema, cyanosis or clubbing; +1 pulses bilaterally Skin: dry; intact; no rashes or lesions    Assessment & Plan:   Problem List Items Addressed This Visit   None Visit  Diagnoses     Wheezing    -  Primary   Relevant Medications   amoxicillin-clavulanate (AUGMENTIN) 875-125 MG tablet   Other Relevant Orders   DG Chest 2 View (Completed)   Chest congestion       Relevant Medications   amoxicillin-clavulanate (AUGMENTIN) 875-125 MG tablet   Bronchitis       Relevant Medications   amoxicillin-clavulanate (AUGMENTIN) 875-125 MG tablet      X-ray shows mild perihilar inflammation and bronchiole thickening consistent with bronchitis. Some opacity in the left lower lobe. Given severity of cough and worsening of symptoms as well as X-ray findings consistent with a possible developing infection, I will plan to treat the patient with amoxicillin-clavulanate to treat again possible community-acquired pneumonia.    Follow up plan: Return if symptoms worsen or fail to improve.  Counseling provided for all of the vaccine components Orders Placed This Encounter  Procedures   DG Chest 2 View    [place], [place] 03/10/2022, 2:07 PM Patient seen and examined with medical student, agree with assessment and plan above.  Because of crackles and concern for possible early pneumonia on x-ray showing bronchiolar thickening, we will go ahead and treat like possible pneumonia with Augmentin. Arville Care, MD [facility] 03/10/2022, 2:08 PM

## 2022-03-16 ENCOUNTER — Telehealth: Payer: BC Managed Care – PPO | Admitting: Family

## 2022-03-16 ENCOUNTER — Encounter: Payer: Self-pay | Admitting: Family

## 2022-03-16 DIAGNOSIS — J209 Acute bronchitis, unspecified: Secondary | ICD-10-CM | POA: Diagnosis not present

## 2022-03-16 MED ORDER — BENZONATATE 200 MG PO CAPS: 200.0000 mg | ORAL_CAPSULE | Freq: Three times a day (TID) | ORAL | 1 refills | Status: DC | PRN

## 2022-03-16 MED ORDER — ALBUTEROL SULFATE HFA 108 (90 BASE) MCG/ACT IN AERS: 2.0000 | INHALATION_SPRAY | Freq: Four times a day (QID) | RESPIRATORY_TRACT | 0 refills | Status: DC | PRN

## 2022-03-16 MED ORDER — PREDNISONE 10 MG (21) PO TBPK: ORAL_TABLET | ORAL | 0 refills | Status: DC

## 2022-03-16 NOTE — Progress Notes (Signed)
Virtual Visit Consent   [name], you are scheduled for a virtual visit with a [facility] provider today. Just as with appointments in the office, your consent must be obtained to participate. Your consent will be active for this visit and any virtual visit you may have with one of our providers in the next 365 days. If you have a MyChart account, a copy of this consent can be sent to you electronically.  As this is a virtual visit, video technology does not allow for your provider to perform a traditional examination. This may limit your provider's ability to fully assess your condition. If your provider identifies any concerns that need to be evaluated in person or the need to arrange testing (such as labs, EKG, etc.), we will make arrangements to do so. Although advances in technology are sophisticated, we cannot ensure that it will always work on either your end or our end. If the connection with a video visit is poor, the visit may have to be switched to a telephone visit. With either a video or telephone visit, we are not always able to ensure that we have a secure connection.  By engaging in this virtual visit, you consent to the provision of healthcare and authorize for your insurance to be billed (if applicable) for the services provided during this visit. Depending on your insurance coverage, you may receive a charge related to this service.  I need to obtain your verbal consent now. Are you willing to proceed with your visit today? Libni Fusaro has provided verbal consent on 03/16/2022 for a virtual visit (video or telephone). Jannifer Rodney, FNP  Date: 03/16/2022 12:09 PM  Virtual Visit via Video Note   IJannifer Rodney, connected with  [name]  ([id number], 1960/07/31) on 03/16/22 at  5:30 PM EST by a video-enabled telemedicine application and verified that I am speaking with the correct person using two identifiers.  Location: Patient: Virtual Visit Location Patient:  Home Provider: Virtual Visit Location Provider: Office/Clinic   I discussed the limitations of evaluation and management by telemedicine and the availability of in person appointments. The patient expressed understanding and agreed to proceed.    History of Present Illness: [name] is a 61 y.o. who identifies as a female who was assigned female at birth, and is being seen today for congestion and cough. She completed Augmentin 4 days ago.   HPI: Cough This is a new problem. The current episode started 1 to 4 weeks ago. The problem has been waxing and waning. The problem occurs every few minutes. The cough is Non-productive. Associated symptoms include headaches and nasal congestion. Pertinent negatives include no chills, ear congestion, ear pain, fever, myalgias, rhinorrhea, sore throat, shortness of breath or wheezing. She has tried rest for the symptoms.    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   Urinary tract infectious disease 05/22/2018   Impacted cerumen of left ear 11/03/2015   Wrist pain 12/17/2013   Depression 11/26/2013   GAD (generalized anxiety disorder) 11/26/2013    Allergies:  Allergies  Allergen Reactions   Bactrim [Sulfamethoxazole-Trimethoprim]    Hydrocodone Hives   Medications:  Current Outpatient Medications:    albuterol (VENTOLIN HFA) 108 (90 Base) MCG/ACT inhaler, Inhale 2 puffs into the lungs every 6 (six)  hours as needed for wheezing or shortness of breath., Disp: 8 g, Rfl: 0   benzonatate (TESSALON) 200 MG capsule, Take 1 capsule (200 mg total) by mouth 3 (three) times daily as needed., Disp: 30 capsule, Rfl: 1   predniSONE (STERAPRED UNI-PAK 21 TAB) 10 MG (21) TBPK tablet, Use as directed, Disp: 21 tablet, Rfl: 0   ALPRAZolam  (XANAX) 0.5 MG tablet, Take 0.5 mg by mouth at bedtime as needed for sleep., Disp: , Rfl:    buPROPion (WELLBUTRIN XL) 300 MG 24 hr tablet, Take 300 mg by mouth daily., Disp: , Rfl:    escitalopram (LEXAPRO) 20 MG tablet, Take 1 tablet by mouth daily., Disp: , Rfl:    Polyethylene Glycol 3350 (MIRALAX PO), Take by mouth at bedtime., Disp: , Rfl:   Observations/Objective: Patient is well-developed, well-nourished in no acute distress.  Resting comfortably  at home.  Head is normocephalic, atraumatic.  No labored breathing.  Speech is clear and coherent with logical content.  Patient is alert and oriented at baseline.  Dry cough  Assessment and Plan: 1. Acute bronchitis, unspecified organism - predniSONE (STERAPRED UNI-PAK 21 TAB) 10 MG (21) TBPK tablet; Use as directed  Dispense: 21 tablet; Refill: 0 - benzonatate (TESSALON) 200 MG capsule; Take 1 capsule (200 mg total) by mouth 3 (three) times daily as needed.  Dispense: 30 capsule; Refill: 1 - albuterol (VENTOLIN HFA) 108 (90 Base) MCG/ACT inhaler; Inhale 2 puffs into the lungs every 6 (six) hours as needed for wheezing or shortness of breath.  Dispense: 8 g; Refill: 0  - Take meds as prescribed - Use a cool mist humidifier  -Use saline nose sprays frequently -Force fluids -For any cough or congestion  Use plain Mucinex- regular strength or max strength is fine -For fever or aces or pains- take tylenol or ibuprofen. -Throat lozenges if help -New toothbrush in 3 days   Follow Up Instructions: I discussed the assessment and treatment plan with the patient. The patient was provided an opportunity to ask questions and all were answered. The patient agreed with the plan and demonstrated an understanding of the instructions.  A copy of instructions were sent to the patient via MyChart unless otherwise noted below.     The patient was advised to call back or seek an in-person evaluation if the symptoms worsen or if the condition fails  to improve as anticipated.  Time:  I spent 11 minutes with the patient via telehealth technology discussing the above problems/concerns.    Jannifer Rodney, FNP

## 2022-03-24 ENCOUNTER — Ambulatory Visit: Payer: BC Managed Care – PPO | Admitting: Family Medicine

## 2022-03-24 ENCOUNTER — Encounter: Payer: Self-pay | Admitting: Family Medicine

## 2022-03-24 VITALS — BP 114/75 | HR 92 | Temp 97.8°F | Ht 63.0 in | Wt 146.0 lb

## 2022-03-24 DIAGNOSIS — J069 Acute upper respiratory infection, unspecified: Secondary | ICD-10-CM

## 2022-03-24 DIAGNOSIS — J40 Bronchitis, not specified as acute or chronic: Secondary | ICD-10-CM | POA: Diagnosis not present

## 2022-03-24 LAB — VERITOR FLU A/B WAIVED
Influenza A: NEGATIVE
Influenza B: NEGATIVE

## 2022-03-24 MED ORDER — PREDNISONE 20 MG PO TABS: ORAL_TABLET | ORAL | 0 refills | Status: DC

## 2022-03-24 MED ORDER — AMOXICILLIN-POT CLAVULANATE 875-125 MG PO TABS: 1.0000 | ORAL_TABLET | Freq: Two times a day (BID) | ORAL | 0 refills | Status: DC

## 2022-03-24 NOTE — Progress Notes (Signed)
BP 114/75   Pulse 92   Temp 97.8 F (36.6 C)   Ht 5\' 3"  (1.6 m)   Wt 146 lb (66.2 kg)   SpO2 98%   BMI 25.86 kg/m    Subjective:   Patient ID: [name], female    DOB: 1960/06/07, 61 y.o.   MRN: [id number]  HPI: [name] is a 61 y.o. female presenting on 03/24/2022 for Nasal Congestion (And head congestion. Denies cough. Just not getting better.)   HPI Cough and congestion Patient has been fighting cough and congestion and chest congestion for the past couple months but more recently she says some things have changed.  She says over the past week she has developed loss of taste and smell and has more sinus headache and more congestion.  She says that that she was given different antibiotics and prednisone in the past couple months and they have helped and she is using albuterol inhaler and they do help but she feels like it is worsening again.  She is using her albuterol inhaler still.  She did try [employer] as well and it did not seem to help.  She did have more of a sore throat from drainage about a week ago as well.  Relevant past medical, surgical, family and social history reviewed and updated as indicated. Interim medical history since our last visit reviewed. Allergies and medications reviewed and updated.  Review of Systems  Constitutional:  Negative for chills and fever.  HENT:  Positive for congestion, postnasal drip, rhinorrhea, sinus pressure and sore throat. Negative for ear discharge, ear pain and sneezing.   Eyes:  Negative for pain, redness and visual disturbance.  Respiratory:  Positive for cough and wheezing. Negative for chest tightness and shortness of breath.   Cardiovascular:  Negative for chest pain and leg swelling.  Genitourinary:  Negative for difficulty urinating and dysuria.  Musculoskeletal:  Negative for back pain and gait problem.  Skin:  Negative for rash.  Neurological:  Negative for light-headedness and headaches.   Psychiatric/Behavioral:  Negative for agitation and behavioral problems.   All other systems reviewed and are negative.   Per HPI unless specifically indicated above   Allergies as of 03/24/2022       Reactions   Bactrim [sulfamethoxazole-trimethoprim]    Hydrocodone Hives        Medication List        Accurate as of March 24, 2022  8:36 AM. If you have any questions, ask your nurse or doctor.          STOP taking these medications    benzonatate 200 MG capsule Commonly known as: TESSALON Stopped by: Elige Radon Kortez Murtagh, MD   predniSONE 10 MG (21) Tbpk tablet Commonly known as: STERAPRED UNI-PAK 21 TAB Replaced by: predniSONE 20 MG tablet Stopped by: Elige Radon Arpita Fentress, MD       TAKE these medications    albuterol 108 (90 Base) MCG/ACT inhaler Commonly known as: VENTOLIN HFA Inhale 2 puffs into the lungs every 6 (six) hours as needed for wheezing or shortness of breath.   ALPRAZolam 0.5 MG tablet Commonly known as: XANAX Take 0.5 mg by mouth at bedtime as needed for sleep.   amoxicillin-clavulanate 875-125 MG tablet Commonly known as: AUGMENTIN Take 1 tablet by mouth 2 (two) times daily. Started by: Elige Radon Payzlee Ryder, MD   buPROPion 300 MG 24 hr tablet Commonly known as: WELLBUTRIN XL Take 300 mg by mouth daily.   escitalopram 20 MG  tablet Commonly known as: LEXAPRO Take 1 tablet by mouth daily.   MIRALAX PO Take by mouth at bedtime.   predniSONE 20 MG tablet Commonly known as: DELTASONE Take 3 tabs daily for 1 week, then 2 tabs daily for week 2, then 1 tab daily for week 3. Replaces: predniSONE 10 MG (21) Tbpk tablet Started by: Elige Radon Andrei Mccook, MD         Objective:   BP 114/75   Pulse 92   Temp 97.8 F (36.6 C)   Ht 5\' 3"  (1.6 m)   Wt 146 lb (66.2 kg)   SpO2 98%   BMI 25.86 kg/m   Wt Readings from Last 3 Encounters:  03/24/22 146 lb (66.2 kg)  03/04/22 146 lb (66.2 kg)  01/18/22 151 lb (68.5 kg)    Physical  Exam Vitals reviewed.  Constitutional:      General: She is not in acute distress.    Appearance: She is well-developed. She is not diaphoretic.  HENT:     Right Ear: Tympanic membrane, ear canal and external ear normal.     Left Ear: Tympanic membrane, ear canal and external ear normal.     Nose: Mucosal edema and rhinorrhea present.     Right Sinus: No maxillary sinus tenderness or frontal sinus tenderness.     Left Sinus: No maxillary sinus tenderness or frontal sinus tenderness.     Mouth/Throat:     Pharynx: Uvula midline. No oropharyngeal exudate or posterior oropharyngeal erythema.     Tonsils: No tonsillar abscesses.  Eyes:     Conjunctiva/sclera: Conjunctivae normal.  Cardiovascular:     Rate and Rhythm: Normal rate and regular rhythm.     Heart sounds: Normal heart sounds. No murmur heard. Pulmonary:     Effort: Pulmonary effort is normal. No respiratory distress.     Breath sounds: Normal breath sounds. No wheezing, rhonchi or rales.  Chest:     Chest wall: No tenderness.  Musculoskeletal:        General: No tenderness. Normal range of motion.  Skin:    General: Skin is warm and dry.     Findings: No rash.  Neurological:     Mental Status: She is alert and oriented to person, place, and time.     Coordination: Coordination normal.  Psychiatric:        Behavior: Behavior normal.     Flu negative  Assessment & Plan:   Problem List Items Addressed This Visit   None Visit Diagnoses     Upper respiratory tract infection, unspecified type    -  Primary   Relevant Medications   amoxicillin-clavulanate (AUGMENTIN) 875-125 MG tablet   predniSONE (DELTASONE) 20 MG tablet   Other Relevant Orders   Novel Coronavirus, NAA ([facility])   Veritor Flu A/B Waived   Bronchitis       Relevant Medications   amoxicillin-clavulanate (AUGMENTIN) 875-125 MG tablet   predniSONE (DELTASONE) 20 MG tablet   Other Relevant Orders   Novel Coronavirus, NAA ([facility])   Veritor Flu  A/B Waived       Patient has been fighting something off-and-on but it sounds like she has caught something new, will do flu and COVID testing.  But it sounds like her symptoms are worsened over the past week or something new.  Will also do Augmentin and prednisone to see if we can help. Follow up plan: Return if symptoms worsen or fail to improve.  Counseling provided for all of the vaccine  components Orders Placed This Encounter  Procedures   Novel Coronavirus, NAA ([facility])   Veritor Flu A/B Waived    Arville Care, MD [facility] 03/24/2022, 8:36 AM

## 2022-03-25 ENCOUNTER — Telehealth: Payer: Self-pay | Admitting: Family Medicine

## 2022-03-25 ENCOUNTER — Other Ambulatory Visit: Payer: Self-pay | Admitting: Family Medicine

## 2022-03-25 LAB — NOVEL CORONAVIRUS, NAA: SARS-CoV-2, NAA: DETECTED — AB

## 2022-03-25 MED ORDER — NIRMATRELVIR/RITONAVIR (PAXLOVID)TABLET: 3.0000 | ORAL_TABLET | Freq: Two times a day (BID) | ORAL | 0 refills | Status: AC

## 2022-03-25 NOTE — Telephone Encounter (Signed)
Yes I sent in Paxlovid for her, she can choose to continue both the amoxicillin and prednisone or she can stop it but it still could help with the symptoms if she continued it.

## 2022-03-25 NOTE — Telephone Encounter (Signed)
Patient aware and verbalized understanding. °

## 2022-09-02 ENCOUNTER — Telehealth: Payer: BC Managed Care – PPO | Admitting: Family Medicine

## 2022-09-02 DIAGNOSIS — J329 Chronic sinusitis, unspecified: Secondary | ICD-10-CM | POA: Diagnosis not present

## 2022-09-02 DIAGNOSIS — J40 Bronchitis, not specified as acute or chronic: Secondary | ICD-10-CM | POA: Diagnosis not present

## 2022-09-02 MED ORDER — AZITHROMYCIN 250 MG PO TABS: ORAL_TABLET | ORAL | 0 refills | Status: DC

## 2022-09-02 NOTE — Progress Notes (Signed)
    Subjective:    Patient ID: [name], female    DOB: 05-23-1960, 62 y.o.   MRN: [id number]   HPI: [name] is a 62 y.o. female presenting for sore throat and HA starting yesterday. No fever. Denies dyspnea. No cough      03/24/2022    8:19 AM 01/18/2022    2:13 PM 01/18/2022    1:50 PM 01/12/2021   10:14 AM 05/27/2020    9:21 AM  Depression screen PHQ 2/9  Decreased Interest 2 2 0 2 2  Down, Depressed, Hopeless 2 2 0 2 1  PHQ - 2 Score 4 4 0 4 3  Altered sleeping 0 0  1 1  Tired, decreased energy 0 0  2 3  Change in appetite 2 2  0 2  Feeling bad or failure about yourself  0 0  1 1  Trouble concentrating 1 1  0 0  Moving slowly or fidgety/restless 0 0  0 0  Suicidal thoughts 0 0  0 0  PHQ-9 Score 7 7  8 10   Difficult doing work/chores Somewhat difficult Somewhat difficult  Somewhat difficult Somewhat difficult     Relevant past medical, surgical, family and social history reviewed and updated as indicated.  Interim medical history since our last visit reviewed. Allergies and medications reviewed and updated.  ROS:  Review of Systems  Constitutional:  Positive for appetite change (less).  HENT:  Positive for sore throat, trouble swallowing and voice change.   Respiratory:  Negative for cough, choking, chest tightness and shortness of breath.   Gastrointestinal:  Negative for abdominal pain.     Social History   Tobacco Use  Smoking Status Former   Types: Cigarettes   Quit date: 04/26/1992   Years since quitting: 30.3  Smokeless Tobacco Never       Objective:     Wt Readings from Last 3 Encounters:  03/24/22 146 lb (66.2 kg)  03/04/22 146 lb (66.2 kg)  01/18/22 151 lb (68.5 kg)     Exam deferred. Video performed.   Assessment & Plan:  No diagnosis found.  Meds ordered this encounter  Medications   azithromycin (ZITHROMAX Z-PAK) 250 MG tablet    Sig: Take two right away Then one a day for the next 4 days.    Dispense:  6 each    Refill:   0    No orders of the defined types were placed in this encounter.     There are no diagnoses linked to this encounter.  Virtual Video Note  I discussed the limitations, risks, security and privacy concerns of performing an evaluation and management service by video and the availability of in person appointments. The patient was identified with two identifiers. Pt.expressed understanding and agreed to proceed. Pt. Is at home. Dr. Darlyn Read is in his office.  Follow Up Instructions:   I discussed the assessment and treatment plan with the patient. The patient was provided an opportunity to ask questions and all were answered. The patient agreed with the plan and demonstrated an understanding of the instructions.   The patient was advised to call back or seek an in-person evaluation if the symptoms worsen or if the condition fails to improve as anticipated.   Total minutes including chart review and phone contact time: 12   Follow up plan: Return if symptoms worsen or fail to improve.  Mechele Claude, MD [facility]

## 2022-09-03 ENCOUNTER — Encounter: Payer: Self-pay | Admitting: Family Medicine

## 2022-09-29 ENCOUNTER — Encounter: Payer: Self-pay | Admitting: Nurse Practitioner

## 2022-09-29 ENCOUNTER — Ambulatory Visit: Payer: BC Managed Care – PPO | Admitting: Nurse Practitioner

## 2022-09-29 VITALS — BP 110/74 | HR 87 | Temp 98.2°F | Ht 63.0 in | Wt 154.6 lb

## 2022-09-29 DIAGNOSIS — S80261A Insect bite (nonvenomous), right knee, initial encounter: Secondary | ICD-10-CM

## 2022-09-29 DIAGNOSIS — W57XXXA Bitten or stung by nonvenomous insect and other nonvenomous arthropods, initial encounter: Secondary | ICD-10-CM

## 2022-09-29 MED ORDER — DOXYCYCLINE MONOHYDRATE 100 MG PO TABS: 100.0000 mg | ORAL_TABLET | Freq: Two times a day (BID) | ORAL | 0 refills | Status: DC

## 2022-09-29 NOTE — Progress Notes (Signed)
Acute Office Visit  Subjective:     Patient ID: [name], female    DOB: Apr 28, 1960, 62 y.o.   MRN: [id number]  Chief Complaint  Patient presents with   Tick Removal    Tick bite, pulled off Monday from back of right knee. Pt leg and hips aching, has headache. Took ibuprofen for pain/headache    HPI [name] is 62 yrs old present here for an acute visit for tick bite " Was outside in the yard Saturday and got bite by a tick. My husband remove dit but I feel like the head is still in". She reports HA, nausea, fatigue. We will check Alpha Gal and Lyme.Tick head removed. ROS Negative unless indicated in HPI    Objective:    BP 110/74   Pulse 87   Temp 98.2 F (36.8 C) (Temporal)   Ht 5\' 3"  (1.6 m)   Wt 154 lb 9.6 oz (70.1 kg)   SpO2 98%   BMI 27.39 kg/m  BP Readings from Last 3 Encounters:  09/29/22 110/74  03/24/22 114/75  03/04/22 136/86   Wt Readings from Last 3 Encounters:  09/29/22 154 lb 9.6 oz (70.1 kg)  03/24/22 146 lb (66.2 kg)  03/04/22 146 lb (66.2 kg)      Physical Exam Vitals and nursing note reviewed.  Constitutional:      General: She is not in acute distress.    Appearance: Normal appearance. She is normal weight. She is not ill-appearing.  HENT:     Head: Normocephalic and atraumatic.  Eyes:     Extraocular Movements: Extraocular movements intact.     Conjunctiva/sclera: Conjunctivae normal.     Pupils: Pupils are equal, round, and reactive to light.  Cardiovascular:     Rate and Rhythm: Normal rate.     Heart sounds: Normal heart sounds.  Pulmonary:     Effort: Pulmonary effort is normal.     Breath sounds: Normal breath sounds.  Skin:    General: Skin is warm and dry.     Findings: Erythema present.          Comments: Tick bite  Neurological:     General: No focal deficit present.     Mental Status: She is alert and oriented to person, place, and time. Mental status is at baseline.  Psychiatric:        Mood and  Affect: Mood normal.        Behavior: Behavior normal.        Thought Content: Thought content normal.        Judgment: Judgment normal.     No results found for any visits on 09/29/22.      Assessment & Plan:  Tick bite, unspecified site, initial encounter -     Lyme Disease Serology w/Reflex -     Alpha-Gal Panel  Other orders -     Doxycycline Monohydrate; Take 1 tablet (100 mg total) by mouth 2 (two) times daily.  Dispense: 2 tablet; Refill: 0   Tick bites: alpha Gal panel & lyme Diseases/w reflex order Preventive treatment Doxycycline 200 mg 1 dose  - avoid sun, wear sunscreen while on doxy -Pt to report any new fever, joint pain, or rash -Wear protective clothing while outside- Long sleeves and long pants -Put insect repellent on all exposed skin and along clothing -Take a shower as soon as possible after being outside   Return if symptoms worsen or fail to improve.  [address] [place],  NP

## 2022-09-30 ENCOUNTER — Encounter: Payer: Self-pay | Admitting: *Deleted

## 2022-09-30 ENCOUNTER — Telehealth: Payer: Self-pay | Admitting: Family Medicine

## 2022-09-30 NOTE — Telephone Encounter (Signed)
Okay to write as requested, please!

## 2022-09-30 NOTE — Telephone Encounter (Signed)
PATIENT IS REQUESTING A LETTER EXCUSING HER FROM JURY DUTY DUE TO HER MUSCLE DISEASE

## 2022-09-30 NOTE — Telephone Encounter (Signed)
Note provided and available in MyChart

## 2022-09-30 NOTE — Telephone Encounter (Signed)
Pt called requesting to speak to Dr Darlyn Read nurse. Has questions about jury duty.

## 2022-10-03 LAB — ALPHA-GAL PANEL
Allergen Lamb IgE: <0.1 kU/L
Beef IgE: <0.1 kU/L
IgE (Immunoglobulin E), Serum: 15 IU/mL (ref 6–495)
O215-IgE Alpha-Gal: <0.1 kU/L
Pork IgE: <0.1 kU/L

## 2022-10-03 LAB — LYME DISEASE SEROLOGY W/REFLEX: Lyme Total Antibody EIA: NEGATIVE

## 2022-10-05 ENCOUNTER — Encounter: Payer: Self-pay | Admitting: Family Medicine

## 2022-10-05 LAB — HM PAP SMEAR: HPV, high-risk: NEGATIVE

## 2022-10-06 ENCOUNTER — Encounter: Payer: Self-pay | Admitting: Family Medicine

## 2022-10-06 ENCOUNTER — Telehealth: Payer: BC Managed Care – PPO | Admitting: Family Medicine

## 2022-10-06 DIAGNOSIS — J329 Chronic sinusitis, unspecified: Secondary | ICD-10-CM | POA: Diagnosis not present

## 2022-10-06 DIAGNOSIS — J40 Bronchitis, not specified as acute or chronic: Secondary | ICD-10-CM

## 2022-10-06 MED ORDER — AZITHROMYCIN 250 MG PO TABS: ORAL_TABLET | ORAL | 0 refills | Status: DC

## 2022-10-06 MED ORDER — METHYLPREDNISOLONE 4 MG PO TBPK: ORAL_TABLET | ORAL | 0 refills | Status: DC

## 2022-10-06 NOTE — Progress Notes (Signed)
No chief complaint on file.   HPI  Patient presents today for Symptoms include congestion, facial pain, nasal congestion, non productive cough, post nasal drip and sinus pressure. There is no fever, chills, or sweats. Onset of symptoms was a few days ago, gradually worsening since that time.    PMH: Smoking status noted ROS: Per HPI  Objective: There were no vitals taken for this visit. Gen: NAD, alert, cooperative with exam HEENT: NCAT, EOMI, PERRL CV: RRR, good S1/S2, no murmur Resp: CTABL, no wheezes, non-labored Abd: SNTND, BS present, no guarding or organomegaly Ext: No edema, warm Neuro: Alert and oriented, No gross deficits  Assessment and plan:  1. Sinobronchitis     Meds ordered this encounter  Medications   azithromycin (ZITHROMAX Z-PAK) 250 MG tablet    Sig: Take two right away Then one a day for the next 4 days.    Dispense:  6 each    Refill:  0   methylPREDNISolone (MEDROL DOSEPAK) 4 MG TBPK tablet    Sig: Start with 6 on the first day and take one less each day until finished    Dispense:  21 tablet    Refill:  0    Virtual Visit   I discussed the limitations, risks, security and privacy concerns of performing an evaluation and management service by video and the availability of in person appointments. I also discussed with the patient that there may be a patient responsible charge related to this service. The patient expressed understanding and agreed to proceed. Pt. Is at home. Dr. Darlyn Read is in his office.  Follow Up Instructions:   I discussed the assessment and treatment plan with the patient. The patient was provided an opportunity to ask questions and all were answered. The patient agreed with the plan and demonstrated an understanding of the instructions.   The patient was advised to call back or seek an in-person evaluation if the symptoms worsen or if the condition fails to improve as anticipated.  Total minutes including chart review and phone  contact time: 14   Follow up as needed.  Mechele Claude, MD

## 2022-10-11 ENCOUNTER — Encounter: Payer: Self-pay | Admitting: Family Medicine

## 2022-11-15 ENCOUNTER — Other Ambulatory Visit: Payer: Self-pay | Admitting: Obstetrics and Gynecology

## 2022-11-15 DIAGNOSIS — Z1231 Encounter for screening mammogram for malignant neoplasm of breast: Secondary | ICD-10-CM

## 2022-11-29 ENCOUNTER — Ambulatory Visit: Admission: RE | Admit: 2022-11-29 | Payer: BC Managed Care – PPO | Source: Ambulatory Visit

## 2022-11-29 DIAGNOSIS — Z1231 Encounter for screening mammogram for malignant neoplasm of breast: Secondary | ICD-10-CM

## 2023-01-10 ENCOUNTER — Other Ambulatory Visit: Payer: Self-pay | Admitting: *Deleted

## 2023-01-10 ENCOUNTER — Telehealth: Payer: Self-pay | Admitting: Family Medicine

## 2023-01-10 DIAGNOSIS — Z0000 Encounter for general adult medical examination without abnormal findings: Secondary | ICD-10-CM

## 2023-01-10 NOTE — Telephone Encounter (Signed)
Patient has appt with PCP on 9/30 and called to schedule a lab appt on 9/23. Please add lab orders.

## 2023-01-10 NOTE — Telephone Encounter (Signed)
LABS ORDERED AS REQUESTED

## 2023-01-17 ENCOUNTER — Other Ambulatory Visit: Payer: BC Managed Care – PPO

## 2023-01-17 DIAGNOSIS — Z0000 Encounter for general adult medical examination without abnormal findings: Secondary | ICD-10-CM

## 2023-01-17 LAB — BAYER DCA HB A1C WAIVED: HB A1C (BAYER DCA - WAIVED): 5.1 % (ref 4.8–5.6)

## 2023-01-17 LAB — LIPID PANEL

## 2023-01-18 LAB — CMP14+EGFR
ALT: 57 IU/L — ABNORMAL HIGH (ref 0–32)
AST: 108 IU/L — ABNORMAL HIGH (ref 0–40)
Albumin: 4.1 g/dL (ref 3.9–4.9)
Alkaline Phosphatase: 63 IU/L (ref 44–121)
BUN/Creatinine Ratio: 8 — ABNORMAL LOW (ref 12–28)
BUN: 9 mg/dL (ref 8–27)
Bilirubin Total: 0.2 mg/dL (ref 0.0–1.2)
CO2: 24 mmol/L (ref 20–29)
Calcium: 9.2 mg/dL (ref 8.7–10.3)
Chloride: 104 mmol/L (ref 96–106)
Creatinine, Ser: 1.07 mg/dL — ABNORMAL HIGH (ref 0.57–1.00)
Globulin, Total: 1.9 g/dL (ref 1.5–4.5)
Glucose: 91 mg/dL (ref 70–99)
Potassium: 4.2 mmol/L (ref 3.5–5.2)
Sodium: 143 mmol/L (ref 134–144)
Total Protein: 6 g/dL (ref 6.0–8.5)
eGFR: 58 mL/min/{1.73_m2} — ABNORMAL LOW (ref >59)

## 2023-01-18 LAB — CBC WITH DIFFERENTIAL/PLATELET
Basophils Absolute: 0 10*3/uL (ref 0.0–0.2)
Basos: 1 %
EOS (ABSOLUTE): 0.1 10*3/uL (ref 0.0–0.4)
Eos: 4 %
Hematocrit: 39.6 % (ref 34.0–46.6)
Hemoglobin: 12.2 g/dL (ref 11.1–15.9)
Immature Grans (Abs): 0 10*3/uL (ref 0.0–0.1)
Immature Granulocytes: 0 %
Lymphocytes Absolute: 1.5 10*3/uL (ref 0.7–3.1)
Lymphs: 42 %
MCH: 29.1 pg (ref 26.6–33.0)
MCHC: 30.8 g/dL — ABNORMAL LOW (ref 31.5–35.7)
MCV: 95 fL (ref 79–97)
Monocytes Absolute: 0.4 10*3/uL (ref 0.1–0.9)
Monocytes: 10 %
Neutrophils Absolute: 1.6 10*3/uL (ref 1.4–7.0)
Neutrophils: 43 %
Platelets: 236 10*3/uL (ref 150–450)
RBC: 4.19 x10E6/uL (ref 3.77–5.28)
RDW: 12.9 % (ref 11.7–15.4)
WBC: 3.7 10*3/uL (ref 3.4–10.8)

## 2023-01-18 LAB — LIPID PANEL
Cholesterol, Total: 165 mg/dL (ref 100–199)
HDL: 67 mg/dL (ref >39)
LDL CALC COMMENT:: 2.5 ratio (ref 0.0–4.4)
LDL Chol Calc (NIH): 73 mg/dL (ref 0–99)
Triglycerides: 147 mg/dL (ref 0–149)
VLDL Cholesterol Cal: 25 mg/dL (ref 5–40)

## 2023-01-18 LAB — VITAMIN D 25 HYDROXY (VIT D DEFICIENCY, FRACTURES): Vit D, 25-Hydroxy: 74.5 ng/mL (ref 30.0–100.0)

## 2023-01-24 ENCOUNTER — Ambulatory Visit (INDEPENDENT_AMBULATORY_CARE_PROVIDER_SITE_OTHER): Payer: BC Managed Care – PPO | Admitting: Family Medicine

## 2023-01-24 ENCOUNTER — Encounter: Payer: Self-pay | Admitting: Family Medicine

## 2023-01-24 VITALS — BP 112/66 | HR 82 | Temp 97.5°F | Ht 63.0 in | Wt 153.2 lb

## 2023-01-24 DIAGNOSIS — Z0001 Encounter for general adult medical examination with abnormal findings: Secondary | ICD-10-CM

## 2023-01-24 DIAGNOSIS — R109 Unspecified abdominal pain: Secondary | ICD-10-CM | POA: Insufficient documentation

## 2023-01-24 DIAGNOSIS — E7404 McArdle disease: Secondary | ICD-10-CM

## 2023-01-24 DIAGNOSIS — F3341 Major depressive disorder, recurrent, in partial remission: Secondary | ICD-10-CM

## 2023-01-24 DIAGNOSIS — Z0000 Encounter for general adult medical examination without abnormal findings: Secondary | ICD-10-CM

## 2023-01-24 DIAGNOSIS — R251 Tremor, unspecified: Secondary | ICD-10-CM | POA: Insufficient documentation

## 2023-01-24 DIAGNOSIS — R7401 Elevation of levels of liver transaminase levels: Secondary | ICD-10-CM | POA: Insufficient documentation

## 2023-01-24 DIAGNOSIS — J029 Acute pharyngitis, unspecified: Secondary | ICD-10-CM

## 2023-01-24 LAB — URINALYSIS
Bilirubin, UA: NEGATIVE
Glucose, UA: NEGATIVE
Ketones, UA: NEGATIVE
Nitrite, UA: NEGATIVE
Protein,UA: NEGATIVE
RBC, UA: NEGATIVE
Specific Gravity, UA: >1.03 — ABNORMAL HIGH (ref 1.005–1.030)
Urobilinogen, Ur: 0.2 mg/dL (ref 0.2–1.0)
pH, UA: 5.5 (ref 5.0–7.5)

## 2023-01-24 LAB — RAPID STREP SCREEN (MED CTR MEBANE ONLY): Strep Gp A Ag, IA W/Reflex: NEGATIVE

## 2023-01-24 LAB — CULTURE, GROUP A STREP

## 2023-01-24 NOTE — Progress Notes (Signed)
Subjective:  Patient ID: [name], female    DOB: [birth year]  Age: 62 y.o. MRN: [id number]  CC: Annual Exam   HPI [name] presents for annual exam. Declined GYN & breast exams. Hs sore throat and pressure HA for two days. 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 Deals with chronic depression. Recently developed a fine tremor of the hands, intentional. Not able to control. Comes and goes. Fine tremor does not affect activities.      01/24/2023   10:56 AM 09/29/2022   12:28 PM 03/24/2022    8:19 AM  Depression screen PHQ 2/9  Decreased Interest 0 2 2  Down, Depressed, Hopeless 0 1 2  PHQ - 2 Score 0 3 4  Altered sleeping  1 0  Tired, decreased energy  2 0  Change in appetite  1 2  Feeling bad or failure about yourself   1 0  Trouble concentrating  1 1  Moving slowly or fidgety/restless  1 0  Suicidal thoughts  0 0  PHQ-9 Score  10 7  Difficult doing work/chores  Not difficult at all Somewhat difficult    History [name] has a past medical history of Anxiety and McArdle's disease (HCC).   She has a past surgical history that includes Inner ear surgery and Cesarean section.   Her family history includes Cirrhosis in her brother and father; Depression in her brother and sister; Diabetes in her father and mother; Heart disease (age of onset: 2) in her mother.She reports that she quit smoking about 30 years ago. Her smoking use included cigarettes. She has never used smokeless tobacco. She reports current alcohol use. She reports that she does not use drugs.    ROS Review of Systems  Constitutional:  Positive for fatigue. Negative for appetite change, chills, diaphoresis, fever and unexpected weight change.  HENT:  Positive for ear pain (chronic recurring follows with ENT.) and sore throat (2 days). Negative for congestion, hearing loss, postnasal drip, rhinorrhea, sneezing and trouble swallowing.   Eyes:  Negative for pain.  Respiratory:   Negative for cough, chest tightness and shortness of breath.   Cardiovascular:  Negative for chest pain and palpitations.  Gastrointestinal:  Negative for abdominal pain, constipation, diarrhea, nausea and vomiting.  Endocrine: Negative for cold intolerance, heat intolerance, polydipsia, polyphagia and polyuria.  Genitourinary:  Positive for flank pain (right, chronic recurring). Negative for dysuria, frequency and menstrual problem.  Musculoskeletal:  Negative for arthralgias and joint swelling.  Skin:  Negative for rash.  Allergic/Immunologic: Negative for environmental allergies.  Neurological:  Positive for tremors (hnads, intermittent) and headaches (2 days). Negative for dizziness, weakness and numbness.  Psychiatric/Behavioral:  Positive for dysphoric mood.     Objective:  BP 112/66   Pulse 82   Temp (!) 97.5 F (36.4 C)   Ht 5\' 3"  (1.6 m)   Wt 153 lb 3.2 oz (69.5 kg)   SpO2 97%   BMI 27.14 kg/m   BP Readings from Last 3 Encounters:  01/24/23 112/66  09/29/22 110/74  03/24/22 114/75    Wt Readings from Last 3 Encounters:  01/24/23 153 lb 3.2 oz (69.5 kg)  09/29/22 154 lb 9.6 oz (70.1 kg)  03/24/22 146 lb (66.2 kg)     Physical Exam Constitutional:      General: She is not in acute distress.    Appearance: She is well-developed.  HENT:     Head: Normocephalic and atraumatic.  Eyes:  Conjunctiva/sclera: Conjunctivae normal.     Pupils: Pupils are equal, round, and reactive to light.  Neck:     Thyroid: No thyromegaly.  Cardiovascular:     Rate and Rhythm: Normal rate and regular rhythm.     Heart sounds: Normal heart sounds. No murmur heard. Pulmonary:     Effort: Pulmonary effort is normal. No respiratory distress.     Breath sounds: Normal breath sounds. No wheezing or rales.  Abdominal:     General: Bowel sounds are normal. There is no distension.     Palpations: Abdomen is soft.     Tenderness: There is no abdominal tenderness.  Musculoskeletal:         General: Normal range of motion.     Cervical back: Normal range of motion and neck supple.  Lymphadenopathy:     Cervical: No cervical adenopathy.  Skin:    General: Skin is warm and dry.  Neurological:     Mental Status: She is alert and oriented to person, place, and time.     Comments: Fine tremor of hands noted   Psychiatric:        Behavior: Behavior normal.        Thought Content: Thought content normal.        Judgment: Judgment normal.       Assessment & Plan:   [name] was seen today for annual exam.  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 I am also having her maintain her ALPRAZolam, Polyethylene Glycol 3350 (MIRALAX PO), escitalopram, buPROPion, albuterol, and doxycycline.  Allergies as of 01/24/2023       Reactions   Bactrim [sulfamethoxazole-trimethoprim]    Hydrocodone Hives        Medication List        Accurate as of January 24, 2023 11:53 AM. If you have any questions, ask your nurse or doctor.          STOP taking these medications    azithromycin 250 MG tablet Commonly known as: Zithromax Z-Pak Stopped by: Lajoya Dombek   methylPREDNISolone 4 MG Tbpk tablet Commonly known as: MEDROL DOSEPAK Stopped by: Tomer Chalmers       TAKE these medications    albuterol 108 (90 Base) MCG/ACT inhaler Commonly known as: VENTOLIN HFA Inhale 2 puffs into the lungs every 6 (six) hours as needed for wheezing or shortness of breath.   ALPRAZolam 0.5 MG tablet Commonly known as: XANAX Take 0.5 mg by mouth at  bedtime as needed for sleep.   buPROPion 300 MG 24 hr tablet Commonly known as: WELLBUTRIN XL Take 300 mg by mouth daily.   doxycycline 100 MG tablet Commonly known as: ADOXA Take 1 tablet (100 mg total) by mouth 2 (two) times daily.   escitalopram 20 MG tablet Commonly known as: LEXAPRO Take 1 tablet by mouth daily.   MIRALAX PO Take by mouth at bedtime.         Follow-up: Return in about 6 months (around 07/24/2023), or if symptoms worsen or fail  to improve.  Mechele Claude, M.D.

## 2023-01-24 NOTE — Patient Instructions (Signed)

## 2023-01-25 LAB — NOVEL CORONAVIRUS, NAA: SARS-CoV-2, NAA: NOT DETECTED

## 2023-07-25 ENCOUNTER — Ambulatory Visit: Payer: BC Managed Care – PPO | Admitting: Family Medicine

## 2023-07-25 ENCOUNTER — Encounter: Payer: Self-pay | Admitting: Family Medicine

## 2023-07-25 VITALS — BP 100/58 | HR 75 | Temp 97.4°F | Ht 63.0 in | Wt 157.0 lb

## 2023-07-25 DIAGNOSIS — R109 Unspecified abdominal pain: Secondary | ICD-10-CM

## 2023-07-25 DIAGNOSIS — R7401 Elevation of levels of liver transaminase levels: Secondary | ICD-10-CM | POA: Diagnosis not present

## 2023-07-25 DIAGNOSIS — E7404 McArdle disease: Secondary | ICD-10-CM

## 2023-07-25 DIAGNOSIS — G8929 Other chronic pain: Secondary | ICD-10-CM | POA: Diagnosis not present

## 2023-07-25 MED ORDER — TIZANIDINE HCL 4 MG PO TABS: 4.0000 mg | ORAL_TABLET | Freq: Four times a day (QID) | ORAL | 1 refills | Status: DC | PRN

## 2023-07-25 NOTE — Progress Notes (Signed)
 Subjective:  Patient ID: [name], female    DOB: Sep 04, 1960  Age: 63 y.o. MRN: [id number]  CC: Medical Management of Chronic Issues (Would liek to have body scan for cancer. ) and Back Pain (Lower back and around right side. Would like kidneys checked in depth not through urine due to muscle disease. Declined urine. 1 month of pain and more consistent recently. )   HPI [name] presents for right low back pain. 7/10 radiates from right flank to suprapubic area and down the posterolateral right thigh. There is a srong family history of cancer and she requests whole body scan. She relates HA as well. Last CT head was 7-8 years ago. Pt having flank painprimarily on the right. Concerned it may be a sign of internaal process. This contributes to her desire for the whole-body scan.  She is very concerned for her kidneys in particular.  She is not having any dysuria nor has she noted blood in her urine.  However she points to the area specifically over the kidney and the right posterior flank.  With regard to chest she has some recurrent cough.  She has not smoked in over 30 years.  She also has history of McArdle's disease and questions that status.  She would like to have the liver checked closer due to elevated transaminases.     07/25/2023    8:34 AM 01/24/2023   10:56 AM 09/29/2022   12:28 PM  Depression screen PHQ 2/9  Decreased Interest 2 0 2  Down, Depressed, Hopeless 2 0 1  PHQ - 2 Score 4 0 3  Altered sleeping 0  1  Tired, decreased energy 3  2  Change in appetite 1  1  Feeling bad or failure about yourself  1  1  Trouble concentrating 0  1  Moving slowly or fidgety/restless 1  1  Suicidal thoughts 0  0  PHQ-9 Score 10  10  Difficult doing work/chores   Not difficult at all    History [name] has a past medical history of Anxiety and McArdle's disease (HCC).   She has a past surgical history that includes Inner ear surgery and Cesarean section.   Her family history  includes Cirrhosis in her brother and father; Depression in her brother and sister; Diabetes in her father and mother; Heart disease (age of onset: 22) in her mother.She reports that she quit smoking about 31 years ago. Her smoking use included cigarettes. She has never used smokeless tobacco. She reports current alcohol use. She reports that she does not use drugs.    ROS Review of Systems  Constitutional: Negative.   HENT: Negative.    Eyes:  Negative for visual disturbance.  Respiratory:  Negative for shortness of breath.   Cardiovascular:  Negative for chest pain.  Gastrointestinal:  Negative for abdominal pain.  Musculoskeletal:  Negative for arthralgias.    Objective:  BP (!) 100/58   Pulse 75   Temp (!) 97.4 F (36.3 C)   Ht 5\' 3"  (1.6 m)   Wt 157 lb (71.2 kg)   SpO2 95%   BMI 27.81 kg/m   BP Readings from Last 3 Encounters:  07/25/23 (!) 100/58  01/24/23 112/66  09/29/22 110/74    Wt Readings from Last 3 Encounters:  07/25/23 157 lb (71.2 kg)  01/24/23 153 lb 3.2 oz (69.5 kg)  09/29/22 154 lb 9.6 oz (70.1 kg)     Physical Exam Constitutional:      General: She  is not in acute distress.    Appearance: She is well-developed.  Cardiovascular:     Rate and Rhythm: Normal rate and regular rhythm.  Pulmonary:     Breath sounds: Normal breath sounds.  Musculoskeletal:        General: Normal range of motion.  Skin:    General: Skin is warm and dry.  Neurological:     Mental Status: She is alert and oriented to person, place, and time.      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  Dispense: 30 tablet;  Refill: 1     Follow-up: No follow-ups on file.  Mechele Claude, M.D.

## 2023-07-26 LAB — CMP14+EGFR
ALT: 41 IU/L — ABNORMAL HIGH (ref 0–32)
AST: 56 IU/L — ABNORMAL HIGH (ref 0–40)
Albumin: 4.1 g/dL (ref 3.9–4.9)
Alkaline Phosphatase: 77 IU/L (ref 44–121)
BUN/Creatinine Ratio: 10 — ABNORMAL LOW (ref 12–28)
BUN: 9 mg/dL (ref 8–27)
Bilirubin Total: 0.3 mg/dL (ref 0.0–1.2)
CO2: 24 mmol/L (ref 20–29)
Calcium: 9 mg/dL (ref 8.7–10.3)
Chloride: 104 mmol/L (ref 96–106)
Creatinine, Ser: 0.88 mg/dL (ref 0.57–1.00)
Globulin, Total: 2.1 g/dL (ref 1.5–4.5)
Glucose: 80 mg/dL (ref 70–99)
Potassium: 4.1 mmol/L (ref 3.5–5.2)
Sodium: 143 mmol/L (ref 134–144)
Total Protein: 6.2 g/dL (ref 6.0–8.5)
eGFR: 73 mL/min/{1.73_m2} (ref >59)

## 2023-07-26 LAB — CK: Total CK: 2456 U/L (ref 32–182)

## 2023-07-28 ENCOUNTER — Encounter: Payer: Self-pay | Admitting: Family Medicine

## 2023-07-28 ENCOUNTER — Ambulatory Visit: Admitting: Family Medicine

## 2023-07-28 VITALS — BP 113/62 | HR 106 | Temp 100.0°F | Ht 63.0 in | Wt 155.0 lb

## 2023-07-28 DIAGNOSIS — R509 Fever, unspecified: Secondary | ICD-10-CM

## 2023-07-28 DIAGNOSIS — J0100 Acute maxillary sinusitis, unspecified: Secondary | ICD-10-CM | POA: Diagnosis not present

## 2023-07-28 DIAGNOSIS — R52 Pain, unspecified: Secondary | ICD-10-CM | POA: Diagnosis not present

## 2023-07-28 MED ORDER — CEFPROZIL 500 MG PO TABS: 500.0000 mg | ORAL_TABLET | Freq: Two times a day (BID) | ORAL | 0 refills | Status: DC

## 2023-07-28 NOTE — Progress Notes (Signed)
 Chief Complaint  Patient presents with   sick    Freezing, achy, chest heaviness, hoarse voice, tickle in throat but not sore, slight dizzy with a headache. No GI.     HPI  Patient presents today for Patient presents with upper respiratory congestion. Rhinorrhea that is frequently purulent. There is moderate sore throat. Patient reports coughing frequently as well.  No sputum noted. There is 101.1 fever with chills or sweats. The patient denies being short of breath. Onset was yesterday afternoon. Gradually worsening. Tried OTCs without improvement.  PMH: Smoking status noted Review of Systems  Objective: BP 113/62   Pulse (!) 106   Temp 100 F (37.8 C)   Ht 5\' 3"  (1.6 m)   Wt 155 lb (70.3 kg)   SpO2 96%   BMI 27.46 kg/m  Gen: NAD, alert, cooperative with exam HEENT: NCAT, Nasal passages swollen, red TMS RED CV: RRR, good S1/S2, no murmur Resp: Bronchitis changes with scattered wheezes, non-labored Ext: No edema, warm Neuro: Alert and oriented, No gross deficits  Body aches -     COVID-19, Flu A+B and RSV -     Rapid Strep Screen ([facility] ONLY)  Chills with fever -     COVID-19, Flu A+B and RSV -     Rapid Strep Screen ([facility] ONLY)  Acute maxillary sinusitis, recurrence not specified  Other orders -     Cefprozil; Take 1 tablet (500 mg total) by mouth 2 (two) times daily. For infection. Take all of this medication.  Dispense: 20 tablet; Refill: 0

## 2023-07-29 LAB — COVID-19, FLU A+B AND RSV
Influenza A, NAA: NOT DETECTED
Influenza B, NAA: NOT DETECTED
RSV, NAA: NOT DETECTED
SARS-CoV-2, NAA: NOT DETECTED

## 2023-07-29 LAB — RAPID STREP SCREEN (MED CTR MEBANE ONLY): Strep Gp A Ag, IA W/Reflex: NEGATIVE

## 2023-07-29 LAB — CULTURE, GROUP A STREP

## 2023-08-01 ENCOUNTER — Encounter: Payer: Self-pay | Admitting: Family Medicine

## 2023-08-10 ENCOUNTER — Ambulatory Visit (HOSPITAL_BASED_OUTPATIENT_CLINIC_OR_DEPARTMENT_OTHER)
Admission: RE | Admit: 2023-08-10 | Discharge: 2023-08-10 | Disposition: A | Discharge status: Home / Self Care | Payer: Self-pay | Source: Ambulatory Visit | Attending: Family Medicine | Admitting: Family Medicine

## 2023-08-10 DIAGNOSIS — G8929 Other chronic pain: Secondary | ICD-10-CM | POA: Insufficient documentation

## 2023-08-10 DIAGNOSIS — R109 Unspecified abdominal pain: Secondary | ICD-10-CM | POA: Insufficient documentation

## 2023-08-10 DIAGNOSIS — E7404 McArdle disease: Secondary | ICD-10-CM | POA: Diagnosis present

## 2023-08-10 MED ORDER — IOHEXOL 300 MG/ML  SOLN
100.0000 mL | Freq: Once | INTRAMUSCULAR | Status: AC | PRN
  Administered 2023-08-10: 100 mL via INTRAVENOUS

## 2023-08-17 ENCOUNTER — Telehealth: Payer: Self-pay

## 2023-08-17 NOTE — Telephone Encounter (Signed)
 Informed pt results are not back yet and that it can take a couple weeks sometimes but we will call when we get the report. Pt verbalized understanding.

## 2023-08-17 NOTE — Telephone Encounter (Signed)
 Copied from CRM [id number]. Topic: Clinical - Lab/Test Results >> Aug 17, 2023  1:39 PM Antwanette L wrote: Reason for CRM: Patient is calling in about imaging results from 4/16. Patient is requesting a callback once results are ready. Patient can be contacted at [phone]

## 2023-08-21 ENCOUNTER — Encounter: Payer: Self-pay | Admitting: Family Medicine

## 2024-01-04 ENCOUNTER — Ambulatory Visit (INDEPENDENT_AMBULATORY_CARE_PROVIDER_SITE_OTHER): Admitting: Psychology

## 2024-01-04 DIAGNOSIS — F331 Major depressive disorder, recurrent, moderate: Secondary | ICD-10-CM | POA: Diagnosis not present
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
## 2024-01-23 ENCOUNTER — Telehealth: Payer: Self-pay

## 2024-01-23 DIAGNOSIS — N289 Disorder of kidney and ureter, unspecified: Secondary | ICD-10-CM

## 2024-01-23 DIAGNOSIS — R748 Abnormal levels of other serum enzymes: Secondary | ICD-10-CM

## 2024-01-23 DIAGNOSIS — E7404 McArdle disease: Secondary | ICD-10-CM

## 2024-01-23 NOTE — Telephone Encounter (Signed)
 Copied from CRM #[id number]. Topic: Clinical - Request for Lab/Test Order >> Jan 23, 2024 10:12 AM Charolett L wrote: Reason for CRM: patient requesting for lab order to be put in so that she can discuss them prior to her appt

## 2024-01-30 ENCOUNTER — Ambulatory Visit: Admitting: Family Medicine

## 2024-02-07 ENCOUNTER — Telehealth: Payer: Self-pay | Admitting: Family Medicine

## 2024-02-07 ENCOUNTER — Other Ambulatory Visit: Payer: Self-pay | Admitting: Family Medicine

## 2024-02-07 ENCOUNTER — Other Ambulatory Visit

## 2024-02-07 DIAGNOSIS — R748 Abnormal levels of other serum enzymes: Secondary | ICD-10-CM

## 2024-02-07 DIAGNOSIS — N289 Disorder of kidney and ureter, unspecified: Secondary | ICD-10-CM

## 2024-02-07 DIAGNOSIS — E7404 McArdle disease: Secondary | ICD-10-CM

## 2024-02-07 NOTE — Telephone Encounter (Signed)
 Pt wants to ask Dr Zollie to add on A1c check at her apt. She says due to family history of diabetes.

## 2024-02-07 NOTE — Telephone Encounter (Signed)
 Aic ordered. Please give lab an add - on sheet

## 2024-02-08 ENCOUNTER — Ambulatory Visit (INDEPENDENT_AMBULATORY_CARE_PROVIDER_SITE_OTHER): Admitting: Psychology

## 2024-02-08 DIAGNOSIS — F331 Major depressive disorder, recurrent, moderate: Secondary | ICD-10-CM | POA: Diagnosis not present

## 2024-02-08 LAB — COMPREHENSIVE METABOLIC PANEL WITH GFR
ALT: 105 IU/L — ABNORMAL HIGH (ref 0–32)
AST: 168 IU/L — ABNORMAL HIGH (ref 0–40)
Albumin: 4.3 g/dL (ref 3.9–4.9)
Alkaline Phosphatase: 73 IU/L (ref 49–135)
BUN/Creatinine Ratio: 18 (ref 12–28)
BUN: 17 mg/dL (ref 8–27)
Bilirubin Total: 0.4 mg/dL (ref 0.0–1.2)
CO2: 25 mmol/L (ref 20–29)
Calcium: 9.4 mg/dL (ref 8.7–10.3)
Chloride: 105 mmol/L (ref 96–106)
Creatinine, Ser: 0.97 mg/dL (ref 0.57–1.00)
Globulin, Total: 2 g/dL (ref 1.5–4.5)
Glucose: 102 mg/dL — ABNORMAL HIGH (ref 70–99)
Potassium: 4.4 mmol/L (ref 3.5–5.2)
Sodium: 144 mmol/L (ref 134–144)
Total Protein: 6.3 g/dL (ref 6.0–8.5)
eGFR: 65 mL/min/1.73 (ref >59)

## 2024-02-08 LAB — LIPID PANEL
Chol/HDL Ratio: 2.7 ratio (ref 0.0–4.4)
Cholesterol, Total: 161 mg/dL (ref 100–199)
HDL: 60 mg/dL (ref >39)
LDL Chol Calc (NIH): 75 mg/dL (ref 0–99)
Triglycerides: 152 mg/dL — ABNORMAL HIGH (ref 0–149)
VLDL Cholesterol Cal: 26 mg/dL (ref 5–40)

## 2024-02-08 LAB — CBC WITH DIFFERENTIAL/PLATELET
Basophils Absolute: 0 x10E3/uL (ref 0.0–0.2)
Basos: 1 %
EOS (ABSOLUTE): 0.2 x10E3/uL (ref 0.0–0.4)
Eos: 4 %
Hematocrit: 42 % (ref 34.0–46.6)
Hemoglobin: 13.8 g/dL (ref 11.1–15.9)
Immature Grans (Abs): 0 x10E3/uL (ref 0.0–0.1)
Immature Granulocytes: 0 %
Lymphocytes Absolute: 1.7 x10E3/uL (ref 0.7–3.1)
Lymphs: 39 %
MCH: 31.5 pg (ref 26.6–33.0)
MCHC: 32.9 g/dL (ref 31.5–35.7)
MCV: 96 fL (ref 79–97)
Monocytes Absolute: 0.5 x10E3/uL (ref 0.1–0.9)
Monocytes: 12 %
Neutrophils Absolute: 2 x10E3/uL (ref 1.4–7.0)
Neutrophils: 44 %
Platelets: 240 x10E3/uL (ref 150–450)
RBC: 4.38 x10E6/uL (ref 3.77–5.28)
RDW: 13.3 % (ref 11.7–15.4)
WBC: 4.4 x10E3/uL (ref 3.4–10.8)

## 2024-02-08 LAB — CK: Total CK: 15736 U/L — AB (ref 32–182)

## 2024-02-08 NOTE — Progress Notes (Signed)
 [facility] Counselor/Therapist Progress Note  Patient ID: [name], MRN: [id number],    Date: 02/08/2024  Time Spent: 10:00am-10:55am  55 minutes   Treatment Type: Individual Therapy  Reported Symptoms: stress, worry  Mental Status Exam: Appearance:  Casual     Behavior: Appropriate  Motor: Normal  Speech/Language:  Normal Rate  Affect: Appropriate  Mood: normal  Thought process: normal  Thought content:   WNL  Sensory/Perceptual disturbances:   WNL  Orientation: oriented to person, place, time/date, and situation  Attention: Good  Concentration: Good  Memory: WNL  Fund of knowledge:  Good  Insight:   Good  Judgment:  Good  Impulse Control: Good   Risk Assessment: Danger to Self:  No Self-injurious Behavior: No Danger to Others: No Duty to Warn:no Physical Aggression / Violence:No  Access to Firearms a concern: No  Gang Involvement:No   Subjective: Pt present for face-to-face individual therapy in person.   Pt had a trip to [place] last week and it was a great trip.  It was a group bus trip and pt went with a couple of people she knows.  Pt is in the process of getting enrolled in Medicare and trying to cut back her work hours.  Pt is anxious about retiring bc she is worried about money.  Pt is also going to have a hard time leaving her clients.  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  Pt is also worried about what to do with her time once she retires.  Worked with pt on exploring options.  Addressed how important it is to have something to retire to.  Pt states that her mood is good today but she still has days when she feels down and feels like a cloud is hanging over her.   Pt is dreading going back to work after being on vacation.   She has a short work day today and then a really long day tomorrow.   Pt talked about being a Curator.  She tends to watch a lot of tv.  Pt needs social  stimulation but also likes time to herself.   Pt often feels like she does not fit in.  Pt doesn't have a best friend right now.  Pt tends to be hard on herself and sees things as the glass half empty.  Pt states her mother was like that too.   Addressed pt's self talk and how it impacts her mood.   Worked on thought reframing. Pt does not feel like her medications are working for her as well as they use to and she may want to change psychiatry providers.   Worked on self care strategies. Provided supportive therapy.    Interventions: Cognitive Behavioral Therapy and Insight-Oriented  Diagnosis:  F33.1   Plan of Care: Recommend ongoing therapy.   Pt participated in setting treatment goals.  Pt wants to have a safe place to talk and to improve coping skills and feel better about herself.  Plan to meet monthly.   Pt is in agreement with treatment plan.  Treatment Plan Client Abilities/Strengths  Pt is bright, engaging, and motivated for therapy.   Client Treatment Preferences  Individual therapy.  Client Statement of Needs  Improve coping skills.  Symptoms  Depressed or irritable mood. Diminished interest in or enjoyment of activities. Lack of energy. Feelings of hopelessness, worthlessness, or inappropriate guilt. Low self-esteem.  Problems Addressed  Unipolar Depression Goals  1. Alleviate depressive symptoms and return to previous level of effective functioning. 2. Appropriately grieve the loss in order to normalize mood and to return to previously adaptive level of functioning. Objective Learn and implement behavioral strategies to overcome depression. Target Date: 2025-01-03 Frequency: Monthly  Progress: 10 Modality: individual  Related Interventions Engage the client in behavioral activation, increasing his/her activity level and contact with sources of reward, while identifying processes that inhibit activation.  Use behavioral techniques such as instruction, rehearsal,  role-playing, role reversal, as needed, to facilitate activity in the client's daily life; reinforce success. Assist the client in developing skills that increase the likelihood of deriving pleasure from behavioral activation (e.g., assertiveness skills, developing an exercise plan, less internal/more external focus, increased social involvement); reinforce success. Objective Identify important people in life, past and present, and describe the quality, good and poor, of those relationships. Target Date: 2025-01-03 Frequency: Monthly  Progress: 10 Modality: individual  Related Interventions Conduct Interpersonal Therapy beginning with the assessment of the client's interpersonal inventory of important past and present relationships; develop a case formulation linking depression to grief, interpersonal role disputes, role transitions, and/or interpersonal deficits). Objective Learn and implement problem-solving and decision-making skills. Target Date: 2025-01-03 Frequency: Monthly  Progress: 10 Modality: individual  Related Interventions Conduct Problem-Solving Therapy using techniques such as psychoeducation, modeling, and role-playing to teach client problem-solving skills (i.e., defining a problem specifically, generating possible solutions, evaluating the pros and cons of each solution, selecting and implementing a plan of action, evaluating the efficacy of the plan, accepting or revising the plan); role-play application of the problem-solving skill to a real life issue. Encourage in the client the development of a positive problem orientation in which problems and solving them are viewed as a natural part of life and not something to be feared, despaired, or avoided. 3. Develop healthy interpersonal relationships that lead to the alleviation and help prevent the relapse of depression. 4. Develop healthy thinking patterns and beliefs about self, others, and the world that lead to the alleviation  and help prevent the relapse of depression. 5. Recognize, accept, and cope with feelings of depression. Diagnosis F33.1  Conditions For Discharge Achievement of treatment goals and objectives   Veva Alma, LCSW

## 2024-02-09 NOTE — Telephone Encounter (Signed)
 Lab paper filled out and taken to the lab

## 2024-02-10 LAB — HGB A1C W/O EAG: Hgb A1c MFr Bld: 5.5 % (ref 4.8–5.6)

## 2024-02-10 LAB — SPECIMEN STATUS REPORT

## 2024-02-13 ENCOUNTER — Encounter: Payer: Self-pay | Admitting: Family Medicine

## 2024-02-13 ENCOUNTER — Ambulatory Visit: Admitting: Family Medicine

## 2024-02-13 VITALS — BP 118/70 | HR 70 | Temp 98.3°F | Ht 63.0 in | Wt 159.4 lb

## 2024-02-13 DIAGNOSIS — R7401 Elevation of levels of liver transaminase levels: Secondary | ICD-10-CM

## 2024-02-13 DIAGNOSIS — K5904 Chronic idiopathic constipation: Secondary | ICD-10-CM | POA: Diagnosis not present

## 2024-02-13 DIAGNOSIS — F331 Major depressive disorder, recurrent, moderate: Secondary | ICD-10-CM | POA: Diagnosis not present

## 2024-02-13 DIAGNOSIS — E7404 McArdle disease: Secondary | ICD-10-CM | POA: Diagnosis not present

## 2024-02-13 NOTE — Progress Notes (Signed)
 "  Subjective:  Patient ID: [name], female    DOB: Jul 24, 1960  Age: 63 y.o. MRN: [id number]  CC: Medical Management of Chronic Issues   HPI  Discussed the use of AI scribe software for clinical note transcription with the patient, who gave verbal consent to proceed.  History of Present Illness [name] is a 63 year old female with McArdle's disease who presents for follow-up on lab results and management of elevated liver enzymes.  She is concerned about her recent lab results, particularly elevated liver enzymes and CK levels. Her A1C is 5.5, which is normal. She has a history of McArdle's disease, which has previously caused elevated CK levels. Recently, her CK levels have increased significantly, attributed to increased physical activity during a trip to [place]. She engaged in extensive walking and experienced fatigue, which she believes contributed to the rise in CK levels. Her CK levels are currently seven times higher than six months ago and fifteen times higher than a couple of years ago.  Her liver enzymes, specifically AST, have been fluctuating over the years but are currently elevated. She consumes very little alcohol and uses minimal Tylenol . A CT of the chest, abdomen, and pelvis six months ago showed fat deposits but nothing serious. She quit smoking thirty years ago after smoking two packs a day. A previous CT scan showed a tiny spot on her lung.  She is currently on alprazolam, bupropion, and citalopram. She was previously on a higher dose of bupropion but reduced it due to side effects. She uses Miralax as needed for constipation, sometimes taking it daily and other times skipping days. Her last colonoscopy was in May 2022, where one polyp was found.  She has a family history of cirrhosis. She experiences jaw pain at night. Her coronary calcium score was zero three years ago.          02/13/2024    2:44 PM 07/28/2023    4:35 PM 07/25/2023    8:34 AM   Depression screen PHQ 2/9  Decreased Interest 2 2 2   Down, Depressed, Hopeless 2 2 2   PHQ - 2 Score 4 4 4   Altered sleeping 1 1 0  Tired, decreased energy 3 2 3   Change in appetite 2 1 1   Feeling bad or failure about yourself  1 1 1   Trouble concentrating 0 1 0  Moving slowly or fidgety/restless 0 1 1  Suicidal thoughts 0 0 0  PHQ-9 Score 11 11 10   Difficult doing work/chores  Not difficult at all       02/13/2024    2:44 PM 07/28/2023    4:35 PM 07/25/2023    8:35 AM 09/29/2022   12:29 PM  GAD 7 : Generalized Anxiety Score  Nervous, Anxious, on Edge 2 1 1 2   Control/stop worrying 1 2 2 2   Worry too much - different things 2 2 2 2   Trouble relaxing 1 1 1 1   Restless 0 0 0 1  Easily annoyed or irritable 1 1 1 1   Afraid - awful might happen 1 1 1  0  Total GAD 7 Score 8 8 8 9   Anxiety Difficulty Somewhat difficult Not difficult at all Not difficult at all Somewhat difficult     History [name] has a past medical history of Anxiety and McArdle's disease (HCC).   She has a past surgical history that includes Inner ear surgery and Cesarean section.   Her family history includes Cirrhosis in her brother and father; Depression  in her brother and sister; Diabetes in her father and mother; Heart disease (age of onset: 43) in her mother.She reports that she quit smoking about 31 years ago. Her smoking use included cigarettes. She has never used smokeless tobacco. She reports current alcohol use. She reports that she does not use drugs.    ROS Review of Systems  Constitutional: Negative.   HENT:  Negative for congestion.   Eyes:  Negative for visual disturbance.  Respiratory:  Negative for shortness of breath.   Cardiovascular:  Negative for chest pain.  Gastrointestinal:  Negative for abdominal pain, constipation, diarrhea, nausea and vomiting.  Genitourinary:  Negative for difficulty urinating.  Musculoskeletal:  Negative for arthralgias and myalgias.  Neurological:  Negative for  headaches.  Psychiatric/Behavioral:  Negative for sleep disturbance.     Objective:  BP 118/70   Pulse 70   Temp 98.3 F (36.8 C)   Ht 5' 3 (1.6 m)   Wt 159 lb 6.4 oz (72.3 kg)   SpO2 97%   BMI 28.24 kg/m   BP Readings from Last 3 Encounters:  02/13/24 118/70  07/28/23 113/62  07/25/23 (!) 100/58    Wt Readings from Last 3 Encounters:  02/13/24 159 lb 6.4 oz (72.3 kg)  07/28/23 155 lb (70.3 kg)  07/25/23 157 lb (71.2 kg)     Physical Exam Constitutional:      General: She is not in acute distress.    Appearance: She is well-developed.  HENT:     Head: Normocephalic and atraumatic.  Eyes:     Conjunctiva/sclera: Conjunctivae normal.     Pupils: Pupils are equal, round, and reactive to light.  Neck:     Thyroid : No thyromegaly.  Cardiovascular:     Rate and Rhythm: Normal rate and regular rhythm.     Heart sounds: Normal heart sounds. No murmur heard. Pulmonary:     Effort: Pulmonary effort is normal. No respiratory distress.     Breath sounds: Normal breath sounds. No wheezing or rales.  Abdominal:     General: Bowel sounds are normal. There is no distension.     Palpations: Abdomen is soft.     Tenderness: There is no abdominal tenderness.  Musculoskeletal:        General: Normal range of motion.     Cervical back: Normal range of motion and neck supple.  Lymphadenopathy:     Cervical: No cervical adenopathy.  Skin:    General: Skin is warm and dry.  Neurological:     Mental Status: She is alert and oriented to person, place, and time.  Psychiatric:        Behavior: Behavior normal.        Thought Content: Thought content normal.        Judgment: Judgment normal.    Physical Exam GENERAL: Alert, cooperative, well developed, no acute distress, normal exam findings HEENT: Normocephalic, normal oropharynx, moist mucous membranes CHEST: Clear to auscultation bilaterally, no wheezes, rhonchi, or crackles CARDIOVASCULAR: Normal heart rate and rhythm, S1  and S2 normal without murmurs ABDOMEN: Soft, non-tender, non-distended, without organomegaly, normal bowel sounds EXTREMITIES: No cyanosis or edema NEUROLOGICAL: Cranial nerves grossly intact, moves all extremities without gross motor or sensory deficit   Assessment & Plan:  Transaminasemia -     US  ABDOMEN LIMITED RUQ (LIVER/GB); Future  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 Although levels are significantly higher than previous measurements, this is not immediately concerning given McArdle's disease. No urgent intervention is required. Order CK level in six weeks to ensure levels return to baseline.  Elevated liver enzymes and fatty liver   Elevated AST levels, which have fluctuated over the years, are now more pronounced and likely related to fatty liver, as previous imaging showed fat deposits. No significant alcohol consumption is reported. There is no immediate danger, but further evaluation is warranted. Order a liver ultrasound to assess liver condition and check additional liver enzymes as needed.  Depression and anxiety   High scores on depression and anxiety scales are currently managed by a psychiatrist and therapist. Recent medication changes include increasing bupropion to 300 mg, which caused adverse effects, leading to a reduction back to 150 mg. Lexapro dosage was increased but caused akathisia, so it was reduced back to 20 mg. There is a plan to switch to Trintellix, but cost is a concern due to upcoming Medicare coverage. Continue current medications: bupropion 150 mg once daily and Lexapro 20 mg daily. Consider  switching to Trintellix after discussing cost and coverage with the psychiatrist.  Constipation   Intermittent constipation is managed with Miralax as needed. She prefers not to use it daily to avoid dependency. Continue Miralax as needed for constipation management.       Follow-up: Return in about 1 year (around 02/12/2025), or if symptoms worsen or fail to improve, for [facility] physical.  Butler Der, M.D. "

## 2024-02-16 ENCOUNTER — Encounter: Payer: Self-pay | Admitting: Family Medicine

## 2024-02-23 ENCOUNTER — Ambulatory Visit (HOSPITAL_COMMUNITY)
Admission: RE | Admit: 2024-02-23 | Discharge: 2024-02-23 | Disposition: A | Discharge status: Home / Self Care | Source: Ambulatory Visit | Attending: Family Medicine | Admitting: Family Medicine

## 2024-02-23 ENCOUNTER — Ambulatory Visit: Payer: Self-pay | Admitting: Family Medicine

## 2024-02-23 DIAGNOSIS — R7401 Elevation of levels of liver transaminase levels: Secondary | ICD-10-CM | POA: Insufficient documentation

## 2024-02-23 DIAGNOSIS — K8020 Calculus of gallbladder without cholecystitis without obstruction: Secondary | ICD-10-CM

## 2024-02-23 DIAGNOSIS — K760 Fatty (change of) liver, not elsewhere classified: Secondary | ICD-10-CM

## 2024-03-14 ENCOUNTER — Ambulatory Visit: Admitting: Psychology

## 2024-03-14 DIAGNOSIS — F331 Major depressive disorder, recurrent, moderate: Secondary | ICD-10-CM

## 2024-03-14 NOTE — Progress Notes (Signed)
 [facility] Counselor/Therapist Progress Note  Patient ID: [name], MRN: [id number],    Date: 03/14/2024  Time Spent: 3:00pm - 3:55pm   55 minutes   Treatment Type: Individual Therapy  Reported Symptoms: stress, worry  Mental Status Exam: Appearance:  Casual     Behavior: Appropriate  Motor: Normal  Speech/Language:  Normal Rate  Affect: Appropriate  Mood: normal  Thought process: normal  Thought content:   WNL  Sensory/Perceptual disturbances:   WNL  Orientation: oriented to person, place, time/date, and situation  Attention: Good  Concentration: Good  Memory: WNL  Fund of knowledge:  Good  Insight:   Good  Judgment:  Good  Impulse Control: Good   Risk Assessment: Danger to Self:  No Self-injurious Behavior: No Danger to Others: No Duty to Warn:no Physical Aggression / Violence:No  Access to Firearms a concern: No  Gang Involvement:No   Subjective: Pt present for face-to-face individual therapy in person.   Pt states she has felt overwhelmed this past month bc she is doing a lot of volunteering at church for Christmas projects.   Pt has had early Thanksgiving celebration with her family. Pt states she is ok but thinks awful things at times.  Pt thinks about others in her church and gets jealous.   Pt feels competitive about the work her friend does in her church.  Pt tends to compare herself and take things too personally.  She is also very hard on herself.   Worked with pt on self compassion and thought reframing.  Pt talked about her friends.  She has had some complex relationships and has been hurt by some of her friends.  She does not feel like she can trust them bc they talk about her.  Pt states she still feels sad most days.  She does not look forward to much and does not feel excitement.   She does not want to be let down.  Pt feels dread.   Pt states depression and anxiety are really hard to live with.   Pt talked about her health.   Her doctor thinks she has gallstone disease and fatty liver.  Pt needs to see a specialist. Addressed pt's health concerns.  Pt is cutting back her work hours to 3 days a week in January.  She is worried about not having as much money.  She is also worried about what she can do with the 4 days a week she will have off.   Pt likes to do arts and crafts especially with a group of people.    Pt talked about the holidays.  Pt is responsible for getting the family together for Christmas.  It is stressful to organize and it has not been a good time when they get together.  Worked on self care strategies. Provided supportive therapy.    Interventions: Cognitive Behavioral Therapy and Insight-Oriented  Diagnosis:  F33.1   Plan of Care: Recommend ongoing therapy.   Pt participated in setting treatment goals.  Pt wants to have a safe place to talk and to improve coping skills and feel better about herself.  Plan to meet monthly.   Pt is in agreement with treatment plan.  Treatment Plan Client Abilities/Strengths  Pt is bright, engaging, and motivated for therapy.   Client Treatment Preferences  Individual therapy.  Client Statement of Needs  Improve coping skills.  Symptoms  Depressed or irritable mood. Diminished interest in or enjoyment of activities. Lack of energy. Feelings of hopelessness,  worthlessness, or inappropriate guilt. Low self-esteem.  Problems Addressed  Unipolar Depression Goals 1. Alleviate depressive symptoms and return to previous level of effective functioning. 2. Appropriately grieve the loss in order to normalize mood and to return to previously adaptive level of functioning. Objective Learn and implement behavioral strategies to overcome depression. Target Date: 2025-01-03 Frequency: Monthly  Progress: 10 Modality: individual  Related Interventions Engage the client in behavioral activation, increasing his/her activity level and contact with sources of reward,  while identifying processes that inhibit activation.  Use behavioral techniques such as instruction, rehearsal, role-playing, role reversal, as needed, to facilitate activity in the client's daily life; reinforce success. Assist the client in developing skills that increase the likelihood of deriving pleasure from behavioral activation (e.g., assertiveness skills, developing an exercise plan, less internal/more external focus, increased social involvement); reinforce success. Objective Identify important people in life, past and present, and describe the quality, good and poor, of those relationships. Target Date: 2025-01-03 Frequency: Monthly  Progress: 10 Modality: individual  Related Interventions Conduct Interpersonal Therapy beginning with the assessment of the client's interpersonal inventory of important past and present relationships; develop a case formulation linking depression to grief, interpersonal role disputes, role transitions, and/or interpersonal deficits). Objective Learn and implement problem-solving and decision-making skills. Target Date: 2025-01-03 Frequency: Monthly  Progress: 10 Modality: individual  Related Interventions Conduct Problem-Solving Therapy using techniques such as psychoeducation, modeling, and role-playing to teach client problem-solving skills (i.e., defining a problem specifically, generating possible solutions, evaluating the pros and cons of each solution, selecting and implementing a plan of action, evaluating the efficacy of the plan, accepting or revising the plan); role-play application of the problem-solving skill to a real life issue. Encourage in the client the development of a positive problem orientation in which problems and solving them are viewed as a natural part of life and not something to be feared, despaired, or avoided. 3. Develop healthy interpersonal relationships that lead to the alleviation and help prevent the relapse of  depression. 4. Develop healthy thinking patterns and beliefs about self, others, and the world that lead to the alleviation and help prevent the relapse of depression. 5. Recognize, accept, and cope with feelings of depression. Diagnosis F33.1  Conditions For Discharge Achievement of treatment goals and objectives   Veva Alma, LCSW

## 2024-03-26 ENCOUNTER — Encounter: Payer: Self-pay | Admitting: Physician Assistant

## 2024-03-26 ENCOUNTER — Other Ambulatory Visit: Payer: Self-pay

## 2024-03-26 DIAGNOSIS — E7404 McArdle disease: Secondary | ICD-10-CM

## 2024-03-27 LAB — CK: Total CK: 2110 U/L (ref 32–182)

## 2024-04-04 ENCOUNTER — Ambulatory Visit: Admitting: Psychology

## 2024-04-04 DIAGNOSIS — F331 Major depressive disorder, recurrent, moderate: Secondary | ICD-10-CM

## 2024-04-04 NOTE — Progress Notes (Signed)
 [facility] Counselor/Therapist Progress Note  Patient ID: [name], MRN: [id number],    Date: 04/04/2024  Time Spent: 10:00am-10:55am   55 minutes   Treatment Type: Individual Therapy  Reported Symptoms: stress, worry  Mental Status Exam: Appearance:  Casual     Behavior: Appropriate  Motor: Normal  Speech/Language:  Normal Rate  Affect: Appropriate  Mood: normal  Thought process: normal  Thought content:   WNL  Sensory/Perceptual disturbances:   WNL  Orientation: oriented to person, place, time/date, and situation  Attention: Good  Concentration: Good  Memory: WNL  Fund of knowledge:  Good  Insight:   Good  Judgment:  Good  Impulse Control: Good   Risk Assessment: Danger to Self:  No Self-injurious Behavior: No Danger to Others: No Duty to Warn:no Physical Aggression / Violence:No  Access to Firearms a concern: No  Gang Involvement:No   Subjective: Pt present for face-to-face individual therapy in person.   Pt talked about not feeling well.  Pt is struggling with her thoughts.  She states she has a battlefield in her mind.   Pt states 3 weeks ago she was having a bad day and a friend called and pt was hateful to her.  Pt apologized later but still feels bad about it.  Pt has a lot of negative thoughts in her head.  Pt has a hard time thinking that someone is mad at her.   Pt is feeling overwhelmed about holiday preparationsas well.   Pt is comparing herself to others and their Christmas plans.   Worked on thought reframing.  Worked with pt on releasing worry thoughts.  Pt also gets upset about family dynamics at the holidays.  Her brother is very political and pt disagrees with him.  Her brother thinks the family is against him bc of his politics.  Pt is having a family holiday dinner and is going to set a rule to not talk about politics.  Addressed how pt can do healthy boundary setting.  Pt takes Lexapro 20mg  and Welbutrin 150mg .  Pt takes  Xanax nightly when she goes to bed.  Pt is working with her psychiatrist on possible medication adjustment bc pt states she feels down most of the time.  Pt has had TMS treatment in the past and it did not help her depression.  She does not look forward to much and does not feel excitement.  Pt talked about her health.  She has gallstone disease and fatty liver.  Pt has a doctors appointment January 9th where she will address treatment plan for her issues.  Worked on self care strategies. Provided supportive therapy.    Interventions: Cognitive Behavioral Therapy and Insight-Oriented  Diagnosis:  F33.1   Plan of Care: Recommend ongoing therapy.   Pt participated in setting treatment goals.  Pt wants to have a safe place to talk and to improve coping skills and feel better about herself.  Plan to meet monthly.   Pt is in agreement with treatment plan.  Treatment Plan Client Abilities/Strengths  Pt is bright, engaging, and motivated for therapy.   Client Treatment Preferences  Individual therapy.  Client Statement of Needs  Improve coping skills.  Symptoms  Depressed or irritable mood. Diminished interest in or enjoyment of activities. Lack of energy. Feelings of hopelessness, worthlessness, or inappropriate guilt. Low self-esteem.  Problems Addressed  Unipolar Depression Goals 1. Alleviate depressive symptoms and return to previous level of effective functioning. 2. Appropriately grieve the loss in order to normalize  mood and to return to previously adaptive level of functioning. Objective Learn and implement behavioral strategies to overcome depression. Target Date: 2025-01-03 Frequency: Monthly  Progress: 10 Modality: individual  Related Interventions Engage the client in behavioral activation, increasing his/her activity level and contact with sources of reward, while identifying processes that inhibit activation.  Use behavioral techniques such as instruction, rehearsal,  role-playing, role reversal, as needed, to facilitate activity in the client's daily life; reinforce success. Assist the client in developing skills that increase the likelihood of deriving pleasure from behavioral activation (e.g., assertiveness skills, developing an exercise plan, less internal/more external focus, increased social involvement); reinforce success. Objective Identify important people in life, past and present, and describe the quality, good and poor, of those relationships. Target Date: 2025-01-03 Frequency: Monthly  Progress: 10 Modality: individual  Related Interventions Conduct Interpersonal Therapy beginning with the assessment of the client's interpersonal inventory of important past and present relationships; develop a case formulation linking depression to grief, interpersonal role disputes, role transitions, and/or interpersonal deficits). Objective Learn and implement problem-solving and decision-making skills. Target Date: 2025-01-03 Frequency: Monthly  Progress: 10 Modality: individual  Related Interventions Conduct Problem-Solving Therapy using techniques such as psychoeducation, modeling, and role-playing to teach client problem-solving skills (i.e., defining a problem specifically, generating possible solutions, evaluating the pros and cons of each solution, selecting and implementing a plan of action, evaluating the efficacy of the plan, accepting or revising the plan); role-play application of the problem-solving skill to a real life issue. Encourage in the client the development of a positive problem orientation in which problems and solving them are viewed as a natural part of life and not something to be feared, despaired, or avoided. 3. Develop healthy interpersonal relationships that lead to the alleviation and help prevent the relapse of depression. 4. Develop healthy thinking patterns and beliefs about self, others, and the world that lead to the alleviation  and help prevent the relapse of depression. 5. Recognize, accept, and cope with feelings of depression. Diagnosis F33.1  Conditions For Discharge Achievement of treatment goals and objectives   Veva Alma, LCSW

## 2024-04-06 ENCOUNTER — Ambulatory Visit: Payer: Self-pay

## 2024-04-06 NOTE — Telephone Encounter (Signed)
 Appt made.

## 2024-04-06 NOTE — Telephone Encounter (Signed)
 FYI Only or Action Required?: FYI only for provider: appointment scheduled on 04/09/24.  Patient was last seen in primary care on 02/13/2024 by Zollie Lowers, MD.  Called Nurse Triage reporting Dysuria.  Symptoms began several days ago.  Interventions attempted: Nothing.  Symptoms are: stable.  Triage Disposition: See Physician Within 24 Hours  Patient/caregiver understands and will follow disposition?: Yes Reason for Disposition  Age > 50 years  Answer Assessment - Initial Assessment Questions Patient reports to speaking to GYN about this a while back and thinks everything is fine. Denies blood in the urine. Patient cannot remember the last time she f/u with GYN. Patient reports this is a chronic issue that comes and goes, states she's been tested for UTIs before with these symptoms and its never a UTI  1. SEVERITY: How bad is the pain?  (e.g., Scale 1-10; mild, moderate, or severe)     6/10 when urinating, states when sitting and the discharge comes out it burns 15/10.  2. PATTERN: Is pain present every time you urinate or just sometimes?      Not every time, but most times  3. ONSET: When did the painful urination start?      Few days back  5. FEVER: Do you have a fever? If Yes, ask: What is your temperature, how was it measured, and when did it start?     Denies  6. CAUSE: What do you think is causing the painful urination?  (e.g., UTI, scratch, Herpes sore)     Unsure  7. OTHER SYMPTOMS: Do you have any other symptoms? (e.g., blood in urine, flank pain, genital sores, urgency, vaginal discharge)     Abd cramping, states they feel like menstrual cramps, comes and goes the last week or so. discharge comes out and burns like fire, reports it not smelling great but unsure if its normal or not. Mild urinary frequency  Protocols used: Urination Pain - Female-A-AH  Copied from CRM #[id number]. Topic: Clinical - Red Word Triage >> Apr 06, 2024 12:45 PM Tysheama G  wrote: Red Word that prompted transfer to Nurse Triage: Patient has been experiencing burning with urination, cramping and itching

## 2024-04-09 ENCOUNTER — Ambulatory Visit: Payer: Self-pay | Admitting: Family Medicine

## 2024-04-09 ENCOUNTER — Encounter: Payer: Self-pay | Admitting: Family Medicine

## 2024-04-09 ENCOUNTER — Ambulatory Visit: Admitting: Family Medicine

## 2024-04-09 VITALS — BP 121/66 | HR 81 | Temp 97.5°F | Ht 63.0 in | Wt 158.0 lb

## 2024-04-09 DIAGNOSIS — F331 Major depressive disorder, recurrent, moderate: Secondary | ICD-10-CM

## 2024-04-09 DIAGNOSIS — R300 Dysuria: Secondary | ICD-10-CM

## 2024-04-09 DIAGNOSIS — N898 Other specified noninflammatory disorders of vagina: Secondary | ICD-10-CM

## 2024-04-09 LAB — URINALYSIS, ROUTINE W REFLEX MICROSCOPIC
Bilirubin, UA: NEGATIVE
Glucose, UA: NEGATIVE
Ketones, UA: NEGATIVE
Leukocytes,UA: NEGATIVE
Nitrite, UA: NEGATIVE
Protein,UA: NEGATIVE
Specific Gravity, UA: 1.02 (ref 1.005–1.030)
Urobilinogen, Ur: 0.2 mg/dL (ref 0.2–1.0)
pH, UA: 6.5 (ref 5.0–7.5)

## 2024-04-09 LAB — MICROSCOPIC EXAMINATION
Bacteria, UA: NONE SEEN
Epithelial Cells (non renal): NONE SEEN /HPF (ref 0–10)
RBC, Urine: NONE SEEN /HPF (ref 0–2)
Yeast, UA: NONE SEEN

## 2024-04-09 LAB — WET PREP FOR TRICH, YEAST, CLUE
Clue Cell Exam: NEGATIVE
Trichomonas Exam: NEGATIVE
Yeast Exam: NEGATIVE

## 2024-04-09 MED ORDER — METRONIDAZOLE 500 MG PO TABS: 500.0000 mg | ORAL_TABLET | Freq: Two times a day (BID) | ORAL | 0 refills | Status: DC

## 2024-04-09 NOTE — Progress Notes (Signed)
 Subjective:  Patient ID: [name], female    DOB: 1960/08/17  Age: 63 y.o. MRN: [id number]  CC: Dysuria (Off and on for 0ne week. Discharge that burns. Pt say Ob for this in the past. Pt reports some cramping in lower abdomin. Lower back pain but that was occurring before this. )   HPI  Discussed the use of AI scribe software for clinical note transcription with the patient, who gave verbal consent to proceed.  History of Present Illness [name] is a 63 year old female who presents with a burning sensation during urination and discharge.  She experiences a burning sensation during urination, described as 'setting me on fire', which occurs intermittently. The burning is not constant but comes and goes. Additionally, she reports a watery discharge that exacerbates the burning sensation. This discharge is not mucousy and has been present intermittently, with the most recent episode occurring last week.  She has experienced similar symptoms in the past, which were evaluated by her gynecologist. During a previous evaluation, an ultrasound was performed, and an attempt was made to insert a small tube into one of her tubes, which was painful and could not be completed. The discharge had previously resolved but has recurred recently.  In addition to the burning and discharge, she has experienced cramping over the past week. No persistent flank pain, although she has had it checked previously with no significant findings.  She has a history of bladder issues, including frequent infections, and was previously prescribed Macrobid  to take daily, which she eventually stopped. She recalls having her bladder 'stem stretched' in the past due to recurrent infections.  She is awaiting a GI consultation in January for evaluation of potential gallbladder or liver issues, including fatty liver.          04/09/2024    9:49 AM 02/13/2024    2:44 PM 07/28/2023    4:35 PM  Depression screen PHQ  2/9  Decreased Interest 2 2 2   Down, Depressed, Hopeless 3 2 2   PHQ - 2 Score 5 4 4   Altered sleeping 2 1 1   Tired, decreased energy 3 3 2   Change in appetite 2 2 1   Feeling bad or failure about yourself  1 1 1   Trouble concentrating 1 0 1  Moving slowly or fidgety/restless 1 0 1  Suicidal thoughts 0 0 0  PHQ-9 Score 15 11  11    Difficult doing work/chores Somewhat difficult  Not difficult at all     Data saved with a previous flowsheet row definition    History [name] has a past medical history of Anxiety and McArdle's disease (HCC).   She has a past surgical history that includes Inner ear surgery and Cesarean section.   Her family history includes Cirrhosis in her brother and father; Depression in her brother and sister; Diabetes in her father and mother; Heart disease (age of onset: 2) in her mother.She reports that she quit smoking about 31 years ago. Her smoking use included cigarettes. She has never used smokeless tobacco. She reports current alcohol use. She reports that she does not use drugs.    ROS Review of Systems  Objective:  BP 121/66   Pulse 81   Temp (!) 97.5 F (36.4 C)   Ht 5' 3 (1.6 m)   Wt 158 lb (71.7 kg)   SpO2 99%   BMI 27.99 kg/m   BP Readings from Last 3 Encounters:  04/09/24 121/66  02/13/24 118/70  07/28/23 113/62  Wt Readings from Last 3 Encounters:  04/09/24 158 lb (71.7 kg)  02/13/24 159 lb 6.4 oz (72.3 kg)  07/28/23 155 lb (70.3 kg)     Physical Exam Physical Exam GENERAL: Alert, cooperative, well developed, no acute distress HEENT: Normocephalic, normal oropharynx, moist mucous membranes CHEST: Clear to auscultation bilaterally, No wheezes, rhonchi, or crackles CARDIOVASCULAR: Normal heart rate and rhythm, S1 and S2 normal without murmurs ABDOMEN: Soft, non-tender, non-distended, without organomegaly, Normal bowel sounds EXTREMITIES: No cyanosis or edema NEUROLOGICAL: Cranial nerves grossly intact, Moves all extremities  without gross motor or sensory deficit   Assessment & Plan:  Dysuria -     Urinalysis, Routine w reflex microscopic -     Urine Culture  Vaginal discharge -     WET PREP FOR TRICH, YEAST, CLUE  Moderate episode of recurrent major depressive disorder (HCC)  Other orders -     metroNIDAZOLE ; Take 1 tablet (500 mg total) by mouth 2 (two) times daily.  Dispense: 14 tablet; Refill: 0  Continue depression meds as is per psych. PHQ noted. Psych F/u needed  Assessment and Plan Assessment & Plan Acute vaginitis   She experiences an intermittent burning sensation during urination and a recent onset of watery discharge causing burning. Symptoms have persisted for a week with cramping episodes. A previous similar episode resolved spontaneously. Although cystitis was considered, a urine test was normal. A vaginal swab was performed for laboratory analysis to identify the causative organism.  Elevated liver enzymes and fatty liver   Liver enzymes are elevated, and she has fatty liver. She is advised to lose weight and abstain from alcohol. A follow-up with a GI specialist is scheduled for January 9th for further evaluation and management.       Follow-up: No follow-ups on file.  Butler Der, M.D.

## 2024-04-11 LAB — URINE CULTURE

## 2024-05-04 ENCOUNTER — Other Ambulatory Visit

## 2024-05-04 ENCOUNTER — Encounter: Payer: Self-pay | Admitting: Physician Assistant

## 2024-05-04 ENCOUNTER — Ambulatory Visit: Admitting: Physician Assistant

## 2024-05-04 VITALS — BP 118/68 | HR 90 | Ht 63.0 in | Wt 163.0 lb

## 2024-05-04 DIAGNOSIS — K5900 Constipation, unspecified: Secondary | ICD-10-CM

## 2024-05-04 DIAGNOSIS — R7989 Other specified abnormal findings of blood chemistry: Secondary | ICD-10-CM | POA: Diagnosis not present

## 2024-05-04 DIAGNOSIS — R1011 Right upper quadrant pain: Secondary | ICD-10-CM | POA: Diagnosis not present

## 2024-05-04 DIAGNOSIS — K5904 Chronic idiopathic constipation: Secondary | ICD-10-CM

## 2024-05-04 DIAGNOSIS — R110 Nausea: Secondary | ICD-10-CM

## 2024-05-04 DIAGNOSIS — K760 Fatty (change of) liver, not elsewhere classified: Secondary | ICD-10-CM

## 2024-05-04 DIAGNOSIS — R945 Abnormal results of liver function studies: Secondary | ICD-10-CM

## 2024-05-04 DIAGNOSIS — R7401 Elevation of levels of liver transaminase levels: Secondary | ICD-10-CM

## 2024-05-04 LAB — IBC + FERRITIN
Ferritin: 18.3 ng/mL (ref 10.0–291.0)
Iron: 94 ug/dL (ref 42–145)
Saturation Ratios: 22.8 % (ref 20.0–50.0)
TIBC: 411.6 ug/dL (ref 250.0–450.0)
Transferrin: 294 mg/dL (ref 212.0–360.0)

## 2024-05-04 LAB — CBC WITH DIFFERENTIAL/PLATELET
Basophils Absolute: 0 K/uL (ref 0.0–0.1)
Basophils Relative: 0.6 % (ref 0.0–3.0)
Eosinophils Absolute: 0.2 K/uL (ref 0.0–0.7)
Eosinophils Relative: 3 % (ref 0.0–5.0)
HCT: 44.5 % (ref 36.0–46.0)
Hemoglobin: 14.8 g/dL (ref 12.0–15.0)
Lymphocytes Relative: 39.9 % (ref 12.0–46.0)
Lymphs Abs: 2.3 K/uL (ref 0.7–4.0)
MCHC: 33.3 g/dL (ref 30.0–36.0)
MCV: 90.9 fl (ref 78.0–100.0)
Monocytes Absolute: 0.7 K/uL (ref 0.1–1.0)
Monocytes Relative: 12.3 % — ABNORMAL HIGH (ref 3.0–12.0)
Neutro Abs: 2.5 K/uL (ref 1.4–7.7)
Neutrophils Relative %: 44.2 % (ref 43.0–77.0)
Platelets: 225 K/uL (ref 150.0–400.0)
RBC: 4.89 Mil/uL (ref 3.87–5.11)
RDW: 13.5 % (ref 11.5–15.5)
WBC: 5.7 K/uL (ref 4.0–10.5)

## 2024-05-04 LAB — HEPATIC FUNCTION PANEL
ALT: 50 U/L — ABNORMAL HIGH (ref 3–35)
AST: 69 U/L — ABNORMAL HIGH (ref 5–37)
Albumin: 4.5 g/dL (ref 3.5–5.2)
Alkaline Phosphatase: 75 U/L (ref 39–117)
Bilirubin, Direct: 0.1 mg/dL (ref 0.1–0.3)
Total Bilirubin: 0.3 mg/dL (ref 0.2–1.2)
Total Protein: 6.9 g/dL (ref 6.0–8.3)

## 2024-05-04 LAB — BASIC METABOLIC PANEL WITH GFR
BUN: 16 mg/dL (ref 6–23)
CO2: 34 meq/L — ABNORMAL HIGH (ref 19–32)
Calcium: 9.7 mg/dL (ref 8.4–10.5)
Chloride: 101 meq/L (ref 96–112)
Creatinine, Ser: 0.89 mg/dL (ref 0.40–1.20)
GFR: 68.26 mL/min
Glucose, Bld: 103 mg/dL — ABNORMAL HIGH (ref 70–99)
Potassium: 4.2 meq/L (ref 3.5–5.1)
Sodium: 141 meq/L (ref 135–145)

## 2024-05-04 LAB — PROTIME-INR
INR: 0.9 ratio (ref 0.8–1.0)
Prothrombin Time: 10 s (ref 9.6–13.1)

## 2024-05-04 NOTE — Progress Notes (Signed)
 "    05/04/2024 [name] [id number] Oct 23, 1960  Referring provider: Zollie Lowers, MD Primary GI doctor: Dr. Suzann  ASSESSMENT AND PLAN:  RUQ AB pain with nausea and elevated AST/ALT  normal Alk phos, GERD, will take ibuprofen once a week, rare ETOH Nausea can be before food, not associated with food, intermittent RUQ pain worse with movement, feeling bloating, no fever, chills    Latest Ref Rng & Units 02/07/2024    9:02 AM 07/25/2023    9:29 AM 01/17/2023    9:36 AM  Hepatic Function  Total Protein 6.0 - 8.5 g/dL 6.3  6.2  6.0   Albumin 3.9 - 4.9 g/dL 4.3  4.1  4.1   AST 0 - 40 IU/L 168  56  108   ALT 0 - 32 IU/L 105  41  57   Alk Phosphatase 49 - 135 IU/L 73  77  63   Total Bilirubin 0.0 - 1.2 mg/dL 0.4  0.3  0.2    Platelets 240  History of cirrhosis/alcoholic in brother and father,  08/10/2023 CTAP W fatty liver infiltration unremarkable bile ducts, normal spleen RUQ US  02/23/2024 mild cholelithiasis without acute cholecystitis diffuse increased echogenicity common and fatty liver Pendulous thoracic esophagus small HH on CT scan Not convinced this is her gallbladder with history and symptoms.  Differential includes symptomatic cholelithiasis, hiatal hernia, and gastritis. No biliary obstruction or acute inflammation. PLAN: - Referred to general surgery for gallbladder disease evaluation. - consider HIDA - Scheduled upper endoscopy (EGD) for alternative etiologies I discussed risks of EGD with patient today, including risk of sedation, bleeding or perforation.  Patient provides understanding and gave verbal consent to proceed.    Fatty liver with elevated liver disease    Latest Ref Rng & Units 02/07/2024    9:02 AM 07/25/2023    9:29 AM 01/17/2023    9:36 AM  Hepatic Function  Total Protein 6.0 - 8.5 g/dL 6.3  6.2  6.0   Albumin 3.9 - 4.9 g/dL 4.3  4.1  4.1   AST 0 - 40 IU/L 168  56  108   ALT 0 - 32 IU/L 105  41  57   Alk Phosphatase 49 - 135 IU/L 73  77  63    Total Bilirubin 0.0 - 1.2 mg/dL 0.4  0.3  0.2    Platelets 240  History of cirrhosis/alcoholic in brother and father,  08/10/2023 CTAP W fatty liver infiltration unremarkable bile ducts, normal spleen RUQ US  02/23/2024 mild cholelithiasis without acute cholecystitis diffuse increased echogenicity common and fatty liver - Ordered comprehensive labs for autoimmune and viral hepatitis. - Provided MASLD education and recommended lifestyle modifications (Mediterranean diet, weight loss, exercise). - Discussed potential need for fibrosis assessment (FibroSure or elastography) based on labs.  Constipation 08/10/2023 CT chest abdomen pelvis with contrast moderate colonic stool with no evidence of bowel wall thickening or inflammatory changes - Recommended bowel purge to address stool burden and assess impact on symptoms. - Advised continuation of Miralax, consider linzess trial - Provided education on constipation management.   CCS 08/26/2020 colonoscopy Dr. 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 female with a past medical history listed  below presents for evaluation of nausea and RUQ pain.   Discussed the use of AI scribe software for clinical note transcription with the patient, who gave verbal consent to proceed.  History of Present Illness   [name] is a 64 year old female with fatty liver disease and cholelithiasis who presents for evaluation of persistent right upper quadrant abdominal pain and nausea.  Persistent right upper quadrant abdominal pain has been present for several months, radiating to her side, with  episodes lasting all day. The pain has improved slightly but remains intermittent. Nausea occurs randomly, often before eating, without associated vomiting. No clear relationship exists between symptoms and food intake. She denies fevers, chills, jaundice, dark urine, clay-colored stools, easy bruising, or bleeding.  Abdominal ultrasound on February 23, 2024 showed mild gallstones without acute inflammation and noted changes in the liver's appearance. CT scan in April 2025 showed fatty liver, normal bile ducts, and normal spleen. She reports long-standing constipation, increased recently, and currently uses Miralax. Stools are light brown and bloating is frequent. No history of melena or hematochezia.  Liver function tests have been elevated for years, with recent labs showing further elevation. No new medications were started around the time of the most recent testing. Ibuprofen was used infrequently in the past but has been avoided since learning of liver issues. Flagyl  was taken for an infection, not near the time of the October labs. Current medications include Wellbutrin, Lexapro, vitamin B12, Miralax, fish oil, and vitamin D . Occasional reflux or heartburn occurs, with rare severe burning episodes that resolve with medication. No dysphagia reported.  Family history includes father and brother dying of cirrhosis, both with significant alcohol use. Siblings have had abnormal liver function tests. No known family history of gallbladder disease or autoimmune conditions. She denies significant alcohol use, has not smoked since age 81, and denies IV drug use. Past drug use did not involve intravenous administration. History of muscle disease and lung nodules; recent kidney evaluation was normal.        She  reports that she quit smoking about 32 years ago. Her smoking use included cigarettes. She has never used smokeless tobacco. She reports current alcohol use. She reports that she does not use  drugs.  RELEVANT GI HISTORY, IMAGING AND LABS: Results   Labs Liver function panel: Elevated AST greater than ALT; chronic fluctuating elevations  Radiology Abdominal ultrasound (02/23/2024): Mild cholelithiasis without acute cholecystitis; hepatic contour irregularity consistent with hepatic steatosis CT abdomen and pelvis (07/2023): Hepatic steatosis; normal biliary ducts; normal spleen; colonic fecal retention; small hiatal hernia; elongated esophagus CT chest (07/2023): Pulmonary nodules; no acute pathology      CBC    Component Value Date/Time   WBC 4.4 02/07/2024 0902   WBC 4.9 11/09/2012 1103   WBC 5.5 04/28/2007 0950   RBC 4.38 02/07/2024 0902   RBC 4.4 11/09/2012 1103   RBC 4.45 04/28/2007 0950   HGB 13.8 02/07/2024 0902   HCT 42.0 02/07/2024 0902   PLT 240 02/07/2024 0902   MCV 96 02/07/2024 0902   MCH 31.5 02/07/2024 0902   MCH 33.2 (A) 11/09/2012 1103   MCHC 32.9 02/07/2024 0902   MCHC 36.3 (A) 11/09/2012 1103   MCHC 34.2 04/28/2007 0950   RDW 13.3 02/07/2024 0902   LYMPHSABS 1.7 02/07/2024 0902   EOSABS 0.2 02/07/2024 0902   BASOSABS 0.0 02/07/2024 0902   Recent Labs    02/07/24 0902  HGB 13.8    CMP     Component  Value Date/Time   NA 144 02/07/2024 0902   K 4.4 02/07/2024 0902   CL 105 02/07/2024 0902   CO2 25 02/07/2024 0902   GLUCOSE 102 (H) 02/07/2024 0902   GLUCOSE 85 11/09/2012 1037   BUN 17 02/07/2024 0902   CREATININE 0.97 02/07/2024 0902   CREATININE 0.77 11/09/2012 1037   CALCIUM 9.4 02/07/2024 0902   PROT 6.3 02/07/2024 0902   ALBUMIN 4.3 02/07/2024 0902   AST 168 (H) 02/07/2024 0902   ALT 105 (H) 02/07/2024 0902   ALKPHOS 73 02/07/2024 0902   BILITOT 0.4 02/07/2024 0902   GFRNONAA 72 05/27/2020 1111   GFRNONAA 88 11/09/2012 1037   GFRAA 83 05/27/2020 1111   GFRAA >89 11/09/2012 1037      Latest Ref Rng & Units 02/07/2024    9:02 AM 07/25/2023    9:29 AM 01/17/2023    9:36 AM  Hepatic Function  Total Protein 6.0 - 8.5 g/dL  6.3  6.2  6.0   Albumin 3.9 - 4.9 g/dL 4.3  4.1  4.1   AST 0 - 40 IU/L 168  56  108   ALT 0 - 32 IU/L 105  41  57   Alk Phosphatase 49 - 135 IU/L 73  77  63   Total Bilirubin 0.0 - 1.2 mg/dL 0.4  0.3  0.2       Current Medications:   Current Outpatient Medications (Hematological):    cyanocobalamin (VITAMIN B12) 500 MCG tablet, Take 500 mcg by mouth daily.  Current Outpatient Medications (Other):    ALPRAZolam (XANAX) 0.5 MG tablet, Take 0.5 mg by mouth at bedtime as needed for sleep.   buPROPion (WELLBUTRIN XL) 150 MG 24 hr tablet, Take 150 mg by mouth daily.   cholecalciferol (VITAMIN D3) 25 MCG (1000 UNIT) tablet, Take 1,000 Units by mouth daily.   escitalopram (LEXAPRO) 20 MG tablet, Take 1 tablet by mouth daily.   Multiple Vitamins-Minerals (MULTIVITAMIN WITH MINERALS) tablet, Take 1 tablet by mouth daily.   Omega-3 Fatty Acids (FISH OIL) 300 MG CAPS, Take by mouth.   Polyethylene Glycol 3350 (MIRALAX PO), Take by mouth at bedtime.   metroNIDAZOLE  (FLAGYL ) 500 MG tablet, Take 1 tablet (500 mg total) by mouth 2 (two) times daily. (Patient not taking: Reported on 05/04/2024)  Medical History:  Past Medical History:  Diagnosis Date   Anxiety    McArdle's disease (HCC)    Allergies: Allergies[1]   Surgical History:  She  has a past surgical history that includes Inner ear surgery and Cesarean section. Family History:  Her family history includes Cirrhosis in her brother and father; Depression in her brother and sister; Diabetes in her father and mother; Heart disease (age of onset: 11) in her mother.  REVIEW OF SYSTEMS  : All other systems reviewed and negative except where noted in the History of Present Illness.  PHYSICAL EXAM: BP 118/68   Pulse 90   Ht 5' 3 (1.6 m)   Wt 163 lb (73.9 kg)   BMI 28.87 kg/m  Physical Exam   GENERAL APPEARANCE: Well nourished, in no apparent distress. HEENT: No cervical lymphadenopathy, unremarkable thyroid , sclerae anicteric,  conjunctiva pink. RESPIRATORY: Respiratory effort normal, breath sounds equal bilaterally without rales, rhonchi, or wheezing. CARDIO: Regular rate and rhythm with no murmurs, rubs, or gallops, peripheral pulses intact. ABDOMEN: Soft, non-distended, active bowel sounds in all four quadrants, tenderness to palpation in the right upper quadrant, no rebound, no mass appreciated. RECTAL: Declines. MUSCULOSKELETAL: Full range of  motion, normal gait, without edema. SKIN: Dry, intact without rashes or lesions. No jaundice. NEURO: Alert, oriented, no focal deficits. PSYCH: Cooperative, normal mood and affect.      Alan JONELLE Coombs, PA-C 3:47 PM      [1]  Allergies Allergen Reactions   Bactrim [Sulfamethoxazole-Trimethoprim]    Hydrocodone  Hives   "

## 2024-05-04 NOTE — Patient Instructions (Addendum)
 Your provider has requested that you go to the basement level for lab work before leaving today. Press B on the elevator. The lab is located at the first door on the left as you exit the elevator.  Due to recent changes in healthcare laws, you may see the results of your imaging and laboratory studies on MyChart before your provider has had a chance to review them.  We understand that in some cases there may be results that are confusing or concerning to you. Not all laboratory results come back in the same time frame and the provider may be waiting for multiple results in order to interpret others.  Please give us  48 hours in order for your provider to thoroughly review all the results before contacting the office for clarification of your results.    You have been scheduled for an endoscopy. Please follow written instructions given to you at your visit today.  If you use inhalers (even only as needed), please bring them with you on the day of your procedure.  If you take any of the following medications, they will need to be adjusted prior to your procedure:   DO NOT TAKE 7 DAYS PRIOR TO TEST- Trulicity (dulaglutide) Ozempic, Wegovy (semaglutide) Mounjaro, Zepbound (tirzepatide) Bydureon Bcise (exanatide extended release)  DO NOT TAKE 1 DAY PRIOR TO YOUR TEST Rybelsus (semaglutide) Adlyxin (lixisenatide) Victoza (liraglutide) Byetta (exanatide) ___________________________________________________________________________   I am recommending a bowel purge to aid in cleaning out your bowels.   BOWEL PURGE: OVER THE COUNTER SHOPPING GUIDE:  Purchase 1 (one) 119 GRAM bottle of Miralax Purchase 1 (one) box of 5 mg Dulcolax tablets Purchase 1 (one) 32 ounce bottle of Gatorade  STEPS:  In the morning, mix the entire bottle of Miralax in the 32 ounces of Gatorade, then refrigerate to dissolve completely. Take 4 dulcolax tablets, then wait 1 hour After 1 hour, drink the Miralax  solution you have prepared over the next 2-3 hours. You should expect results within 1 to 6 hours after completing this bowel purge. Go to the ER if you have severe AB pain, can not pass gas or stool in over 12 hours, can not hold down any food.    Miralax is an osmotic laxative.  It only brings more water into the stool.  This is safe to take daily.  Can take up to 17 gram of miralax twice a day.  Mix with juice or coffee.  Start 1 capful for 3-4 days and reassess your response in 3-4 days.  You can increase and decrease the dose based on your response.  Remember, it can take up to 3-4 days to take effect OR for the effects to wear off.   I often pair this with benefiber in the morning to help assure the stool is not too loose.   FIBER SUPPLEMENT You can do metamucil or fibercon once or twice a day but if this causes gas/bloating please switch to Benefiber or Citracel.  Fiber is good for constipation/diarrhea/irritable bowel syndrome.  It can also help with weight loss and can help lower your bad cholesterol (LDL).  Please do 1 TBSP in the morning in water, coffee, or tea.  It can take up to a month before you can see a difference with your bowel movements.  It is cheapest from [employer], [employer], [employer].   Toileting tips to help with your constipation - Drink at least 64-80 ounces of water/liquid per day. - Establish a time to try to move  your bowels every day.  For many people, this is after a cup of coffee or after a meal such as breakfast. - Sit all of the way back on the toilet keeping your back fairly straight and while sitting up, try to rest the tops of your forearms on your upper thighs.   - Raising your feet with a step stool/squatty potty can be helpful to improve the angle that allows your stool to pass through the rectum. - Relax the rectum feeling it bulge toward the toilet water.  If you feel your rectum raising toward your body, you are contracting rather than relaxing. -  Breathe in and slowly exhale. Belly breath by expanding your belly towards your belly button. Keep belly expanded as you gently direct pressure down and back to the anus.  A low pitched GRRR sound can assist with increasing intra-abdominal pressure.  (Can also trying to blow on a pinwheel and make it move, this helps with the same belly breathing) - Repeat 3-4 times. If unsuccessful, contract the pelvic floor to restore normal tone and get off the toilet.  Avoid excessive straining. - To reduce excessive wiping by teaching your anus to normally contract, place hands on outer aspect of knees and resist knee movement outward.  Hold 5-10 second then place hands just inside of knees and resist inward movement of knees.  Hold 5 seconds.  Repeat a few times each way.  Go to the ER if unable to pass gas, severe AB pain, unable to hold down food, any shortness of breath of chest pain.  Metabolic dysfunction associated seatohepatitis  Now the leading cause of liver failure in the [place] .  It is normally from such risk factors as obesity, diabetes, insulin resistance, high cholesterol, or metabolic syndrome.  The only definitive therapy is weight loss and exercise.   Suggest walking 20-30 mins daily.  Decreasing carbohydrates, increasing veggies.    Fatty Liver Fatty liver is the accumulation of fat in liver cells. It is also called hepatosteatosis or steatohepatitis. It is normal for your liver to contain some fat. If fat is more than 5 to 10% of your liver's weight, you have fatty liver.  There are often no symptoms (problems) for years while damage is still occurring. People often learn about their fatty liver when they have medical tests for other reasons. Fat can damage your liver for years or even decades without causing problems. When it becomes severe, it can cause fatigue, weight loss, weakness, and confusion. This makes you more likely to develop more serious liver problems. The liver is  the largest organ in the body. It does a lot of work and often gives no warning signs when it is sick until late in a disease. The liver has many important jobs including: Breaking down foods. Storing vitamins, iron, and other minerals. Making proteins. Making bile for food digestion. Breaking down many products including medications, alcohol and some poisons.  PROGNOSIS  Fatty liver may cause no damage or it can lead to an inflammation of the liver. This is, called steatohepatitis.  Over time the liver may become scarred and hardened. This condition is called cirrhosis. Cirrhosis is serious and may lead to liver failure or cancer. NASH is one of the leading causes of cirrhosis. About 10-20% of Americans have fatty liver and a smaller 2-5% has NASH.  TREATMENT  Weight loss, fat restriction, and exercise in overweight patients produces inconsistent results but is worth trying. Good control of diabetes may  reduce fatty liver. Eat a balanced, healthy diet. Increase your physical activity. There are no medical or surgical treatments for a fatty liver or NASH, but improving your diet and increasing your exercise may help prevent or reverse some of the damage.   Thank you for trusting me with your gastrointestinal care!   Alan Coombs, PA-C  _______________________________________________________  If your blood pressure at your visit was 140/90 or greater, please contact your primary care physician to follow up on this.  _______________________________________________________  If you are age 26 or older, your body mass index should be between 23-30. Your Body mass index is 28.87 kg/m. If this is out of the aforementioned range listed, please consider follow up with your Primary Care Provider.  If you are age 66 or younger, your body mass index should be between 19-25. Your Body mass index is 28.87 kg/m. If this is out of the aformentioned range listed, please consider follow up with your  Primary Care Provider.   ________________________________________________________  The [facility] providers would like to encourage you to use MYCHART to communicate with providers for non-urgent requests or questions.  Due to long hold times on the telephone, sending your provider a message by [facility] may be a faster and more efficient way to get a response.  Please allow 48 business hours for a response.  Please remember that this is for non-urgent requests.  _______________________________________________________  [facility] is using a team-based approach to care.  Your team is made up of your doctor and two to three APPS. Our APPS (Nurse Practitioners and Physician Assistants) work with your physician to ensure care continuity for you. They are fully qualified to address your health concerns and develop a treatment plan. They communicate directly with your gastroenterologist to care for you. Seeing the Advanced Practice Practitioners on your physician's team can help you by facilitating care more promptly, often allowing for earlier appointments, access to diagnostic testing, procedures, and other specialty referrals.

## 2024-05-06 NOTE — Progress Notes (Unsigned)
 [facility] History and Physical   Primary Care Physician:  Zollie Lowers, MD   Reason for Procedure:  Right upper quadrant abdominal pain, nausea  Plan:    Upper endoscopy   The patient was provided an opportunity to ask questions and all were answered. The patient agreed with the plan.   HPI: [name] is a 64 y.o. female undergoing upper endoscopy for investigation of right upper quadrant abdominal pain and nausea.  Nausea not necessarily associated with eating.  No vomiting.  Right upper quadrant abdominal pain is exacerbated by movement.  Endorses feeling bloated.  No fevers or chills.  Imaging has shown cholelithiasis without cholecystitis.  Presence of fatty liver with elevated LFTs.  Patient also has a history of McArdle syndrome.   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 Authorizing Provider  ALPRAZolam (XANAX) 0.5 MG tablet Take 0.5 mg by mouth at bedtime as needed for sleep.    [provider]  buPROPion (WELLBUTRIN XL) 150 MG 24 hr tablet Take 150 mg by mouth daily. 12/27/23   [provider]  cholecalciferol (VITAMIN D3) 25 MCG (1000 UNIT) tablet Take 1,000 Units by mouth daily.    [provider]  cyanocobalamin (VITAMIN B12) 500 MCG tablet Take 500 mcg by mouth daily.    [provider]  escitalopram (LEXAPRO) 20 MG tablet Take 1 tablet by mouth daily. 01/25/19   [provider]  metroNIDAZOLE  (FLAGYL ) 500 MG tablet Take 1 tablet (500 mg total) by mouth 2 (two) times daily. Patient not taking: Reported on 05/04/2024 04/09/24   Zollie Lowers, MD  Multiple Vitamins-Minerals (MULTIVITAMIN WITH MINERALS) tablet Take 1 tablet by mouth daily.    [provider]  Omega-3 Fatty Acids (FISH OIL)  300 MG CAPS Take by mouth.    [provider]  Polyethylene Glycol 3350 (MIRALAX PO) Take by mouth at bedtime.    [provider]    Current Outpatient Medications  Medication Sig Dispense Refill   ALPRAZolam (XANAX) 0.5 MG tablet Take 0.5 mg by mouth at bedtime as needed for sleep.     buPROPion (WELLBUTRIN XL) 150 MG 24 hr tablet Take 150 mg by mouth daily.     cholecalciferol (VITAMIN D3) 25 MCG (1000 UNIT) tablet Take 1,000 Units by mouth daily.     cyanocobalamin (VITAMIN B12) 500 MCG tablet Take 500 mcg by mouth daily.     escitalopram (LEXAPRO) 20 MG tablet Take 1 tablet by mouth daily.     metroNIDAZOLE  (FLAGYL ) 500 MG tablet Take 1 tablet (500 mg total) by mouth 2 (two) times daily. (Patient not taking: Reported on 05/04/2024) 14 tablet 0   Multiple Vitamins-Minerals (MULTIVITAMIN WITH MINERALS) tablet Take 1 tablet by mouth daily.     Omega-3 Fatty Acids (FISH OIL) 300 MG CAPS Take by mouth.     Polyethylene Glycol 3350 (MIRALAX PO) Take by mouth at bedtime.     No current facility-administered medications for this visit.    Allergies as of 05/07/2024 - Review Complete 05/04/2024  Allergen Reaction Noted   Bactrim [sulfamethoxazole-trimethoprim]  11/09/2012   Hydrocodone  Hives 02/04/2016    Family History  Problem Relation Age of Onset   Diabetes Mother    Heart  disease Mother 64   Diabetes Father    Cirrhosis Father    Depression Sister    Depression Brother    Cirrhosis Brother    Breast cancer Neg Hx     Social History   Socioeconomic History   Marital status: Married    Spouse name: Not on file   Number of children: Not on file   Years of education: Not on file   Highest education level: Not on file  Occupational History   Not on file  Tobacco Use   Smoking status: Former    Current packs/day: 0.00    Types: Cigarettes    Quit date: 04/26/1992    Years since quitting: 32.0   Smokeless tobacco: Never  Vaping Use   Vaping status: Never  Used  Substance and Sexual Activity   Alcohol use: Yes    Comment: occ   Drug use: No   Sexual activity: Not on file  Other Topics Concern   Not on file  Social History Narrative   Not on file   Social Drivers of Health   Tobacco Use: Medium Risk (05/04/2024)   Patient History    Smoking Tobacco Use: Former    Smokeless Tobacco Use: Never    Passive Exposure: Not on file  Financial Resource Strain: Patient Declined (04/08/2024)   Overall Financial Resource Strain (CARDIA)    Difficulty of Paying Living Expenses: Patient declined  Food Insecurity: Patient Declined (04/08/2024)   Epic    Worried About Programme Researcher, Broadcasting/film/video in the Last Year: Patient declined    Barista in the Last Year: Patient declined  Transportation Needs: Patient Declined (04/08/2024)   Epic    Lack of Transportation (Medical): Patient declined    Lack of Transportation (Non-Medical): Patient declined  Physical Activity: Not on file  Stress: Not on file  Social Connections: Unknown (04/08/2024)   Social Connection and Isolation Panel    Frequency of Communication with Friends and Family: Patient declined    Frequency of Social Gatherings with Friends and Family: Patient declined    Attends Religious Services: Patient declined    Active Member of Clubs or Organizations: Patient declined    Attends Banker Meetings: Not on file    Marital Status: Patient declined  Intimate Partner Violence: Not At Risk (02/13/2024)   Epic    Fear of Current or Ex-Partner: No    Emotionally Abused: No    Physically Abused: No    Sexually Abused: No  Depression (PHQ2-9): High Risk (04/09/2024)   Depression (PHQ2-9)    PHQ-2 Score: 15  Alcohol Screen: Not on file  Housing: Low Risk (02/13/2024)   Epic    Unable to Pay for Housing in the Last Year: No    Number of Times Moved in the Last Year: 0    Homeless in the Last Year: No  Utilities: Not At Risk (02/13/2024)   Epic    Threatened with loss of  utilities: No  Health Literacy: Not on file    Review of Systems:  All other review of systems negative except as mentioned in the HPI.  Physical Exam: Vital signs There were no vitals taken for this visit.  General:   Alert,  Well-developed, well-nourished, pleasant and cooperative in NAD Airway:  Mallampati  Lungs:  Clear throughout to auscultation.   Heart:  Regular rate and rhythm; no murmurs, clicks, rubs,  or gallops. Abdomen:  Soft, nontender and nondistended. Normal bowel sounds.  Neuro/Psych:  Normal mood and affect. A and O x 3  Inocente Hausen, MD [facility]

## 2024-05-07 ENCOUNTER — Encounter: Payer: Self-pay | Admitting: Pediatrics

## 2024-05-07 ENCOUNTER — Ambulatory Visit: Payer: Self-pay | Admitting: Physician Assistant

## 2024-05-07 ENCOUNTER — Ambulatory Visit: Admitting: Pediatrics

## 2024-05-07 VITALS — BP 122/73 | HR 86 | Temp 97.9°F | Resp 17 | Ht 63.0 in | Wt 163.0 lb

## 2024-05-07 DIAGNOSIS — R1011 Right upper quadrant pain: Secondary | ICD-10-CM

## 2024-05-07 DIAGNOSIS — K449 Diaphragmatic hernia without obstruction or gangrene: Secondary | ICD-10-CM

## 2024-05-07 DIAGNOSIS — R110 Nausea: Secondary | ICD-10-CM | POA: Diagnosis not present

## 2024-05-07 MED ORDER — SODIUM CHLORIDE 0.9 % IV SOLN: 500.0000 mL | Freq: Once | INTRAVENOUS | Status: DC

## 2024-05-07 NOTE — Progress Notes (Signed)
 Pt's states no medical or surgical changes since previsit or office visit.  Pt reports 3 front bottom teeth are loose and have been that way for a while. Discussed with CRNA and reviewed risk of damage with pt. Pt wants to proceed with EGD.

## 2024-05-07 NOTE — Op Note (Signed)
 [facility] Patient Name: [name] Procedure Date: 05/07/2024 1:36 PM MRN: [id number] Endoscopist: Inocente Hausen , MD, [id number] Age: 64 Referring MD:  Date of Birth: 1960-11-20 Gender: Female Account #: [id number] Procedure:                Upper GI endoscopy Indications:              Abdominal pain in the right upper quadrant, Nausea Medicines:                Monitored Anesthesia Care Procedure:                Pre-Anesthesia Assessment:                           - Prior to the procedure, a History and Physical                            was performed, and patient medications and                            allergies were reviewed. The patient's tolerance of                            previous anesthesia was also reviewed. The risks                            and benefits of the procedure and the sedation                            options and risks were discussed with the patient.                            All questions were answered, and informed consent                            was obtained. Prior Anticoagulants: The patient has                            taken no anticoagulant or antiplatelet agents. ASA                            Grade Assessment: II - A patient with mild systemic                            disease. After reviewing the risks and benefits,                            the patient was deemed in satisfactory condition to                            undergo the procedure.                           After obtaining informed consent, the endoscope was  passed under direct vision. Throughout the                            procedure, the patient's blood pressure, pulse, and                            oxygen saturations were monitored continuously. The                            GIF [id number] #[id number] was introduced through the                            mouth, and advanced to the second part of duodenum.                             The upper GI endoscopy was accomplished without                            difficulty. The patient tolerated the procedure                            well. Scope In: Scope Out: Findings:                 The examined esophagus was normal.                           The gastric body, gastric antrum, cardia (on                            retroflexion) and gastric fundus (on retroflexion)                            were normal. Biopsies were taken with a cold                            forceps for Helicobacter pylori testing.                           A small hiatal hernia was present.                           The duodenal bulb and second portion of the                            duodenum were normal. Biopsies for histology were                            taken with a cold forceps for evaluation of celiac                            disease. Complications:            No immediate complications. Estimated blood loss:  Minimal. Estimated Blood Loss:     Estimated blood loss was minimal. Impression:               - Normal esophagus.                           - Normal gastric body, antrum, cardia and gastric                            fundus. Biopsied.                           - Small hiatal hernia.                           - Normal duodenal bulb and second portion of the                            duodenum. Biopsied. Recommendation:           - Discharge patient to home (ambulatory).                           - Await pathology results.                           - The findings and recommendations were discussed                            with the patient's family.                           - Patient has a contact number available for                            emergencies. The signs and symptoms of potential                            delayed complications were discussed with the                            patient. Return to normal activities tomorrow.                             Written discharge instructions were provided to the                            patient. Inocente Hausen, MD 05/07/2024 1:58:18 PM This report has been signed electronically.

## 2024-05-07 NOTE — Progress Notes (Signed)
 Called to room to assist during endoscopic procedure.  Patient ID and intended procedure confirmed with present staff. Received instructions for my participation in the procedure from the performing physician.

## 2024-05-07 NOTE — Progress Notes (Signed)
 Vss nad trans to pacu

## 2024-05-07 NOTE — Patient Instructions (Addendum)
 Discharge patient to home (ambulatory).                           - Await pathology results.                           - The findings and recommendations were discussed                            with the patient's family.                           - Patient has a contact number available for                            emergencies. The signs and symptoms of potential                            delayed complications were discussed with the                            patient. Return to normal activities tomorrow.                            Written discharge instructions were provided to the                            patient.    YOU HAD AN ENDOSCOPIC PROCEDURE TODAY AT THE [facility]:   Refer to the procedure report that was given to you for any specific questions about what was found during the examination.  If the procedure report does not answer your questions, please call your gastroenterologist to clarify.  If you requested that your care partner not be given the details of your procedure findings, then the procedure report has been included in a sealed envelope for you to review at your convenience later.  YOU SHOULD EXPECT: Some feelings of bloating in the abdomen. Passage of more gas than usual.  Walking can help get rid of the air that was put into your GI tract during the procedure and reduce the bloating. If you had a lower endoscopy (such as a colonoscopy or flexible sigmoidoscopy) you may notice spotting of blood in your stool or on the toilet paper. If you underwent a bowel prep for your procedure, you may not have a normal bowel movement for a few days.  Please Note:  You might notice some irritation and congestion in your nose or some drainage.  This is from the oxygen used during your procedure.  There is no need for concern and it should clear up in a day or so.  SYMPTOMS TO REPORT IMMEDIATELY:   Following upper endoscopy (EGD)  Vomiting of blood or coffee ground  material  New chest pain or pain under the shoulder blades  Painful or persistently difficult swallowing  New shortness of breath  Fever of 100F or higher  Black, tarry-looking stools  For urgent or emergent issues, a gastroenterologist can be reached at any hour by calling [phone]. Do not use MyChart messaging for urgent concerns.    DIET:  We do  recommend a small meal at first, but then you may proceed to your regular diet.  Drink plenty of fluids but you should avoid alcoholic beverages for 24 hours.  ACTIVITY:  You should plan to take it easy for the rest of today and you should NOT DRIVE or use heavy machinery until tomorrow (because of the sedation medicines used during the test).    FOLLOW UP: Our staff will call the number listed on your records the next business day following your procedure.  We will call around 7:15- 8:00 am to check on you and address any questions or concerns that you may have regarding the information given to you following your procedure. If we do not reach you, we will leave a message.     If any biopsies were taken you will be contacted by phone or by letter within the next 1-3 weeks.  Please call us  at [phone] if you have not heard about the biopsies in 3 weeks.    SIGNATURES/CONFIDENTIALITY: You and/or your care partner have signed paperwork which will be entered into your electronic medical record.  These signatures attest to the fact that that the information above on your After Visit Summary has been reviewed and is understood.  Full responsibility of the confidentiality of this discharge information lies with you and/or your care-partner.

## 2024-05-08 ENCOUNTER — Telehealth: Payer: Self-pay

## 2024-05-08 LAB — IGA: Immunoglobulin A: 348 mg/dL — ABNORMAL HIGH (ref 70–320)

## 2024-05-08 LAB — HIV ANTIBODY (ROUTINE TESTING W REFLEX)
HIV 1&2 Ab, 4th Generation: NONREACTIVE
HIV FINAL INTERPRETATION: NEGATIVE

## 2024-05-08 LAB — MITOCHONDRIAL ANTIBODIES: Mitochondrial M2 Ab, IgG: <=20 U

## 2024-05-08 LAB — HEPATITIS B SURFACE ANTIGEN: Hepatitis B Surface Ag: NONREACTIVE

## 2024-05-08 LAB — TISSUE TRANSGLUTAMINASE, IGA: (tTG) Ab, IgA: <1 U/mL

## 2024-05-08 LAB — ANA: Anti Nuclear Antibody (ANA): NEGATIVE

## 2024-05-08 LAB — IGG: IgG (Immunoglobin G), Serum: 710 mg/dL (ref 600–1540)

## 2024-05-08 LAB — ANTI-SMOOTH MUSCLE ANTIBODY, IGG: Actin (Smooth Muscle) Antibody (IGG): <20 U

## 2024-05-08 LAB — ALPHA-1-ANTITRYPSIN: A-1 Antitrypsin, Ser: 135 mg/dL (ref 83–199)

## 2024-05-08 LAB — CERULOPLASMIN: Ceruloplasmin: 23 mg/dL (ref 14–48)

## 2024-05-08 LAB — HEPATITIS C ANTIBODY: Hepatitis C Ab: NONREACTIVE

## 2024-05-08 LAB — HEPATITIS A ANTIBODY, TOTAL: Hepatitis A AB,Total: NONREACTIVE

## 2024-05-08 LAB — HEPATITIS B SURFACE ANTIBODY,QUALITATIVE: Hep B S Ab: NONREACTIVE

## 2024-05-08 NOTE — Telephone Encounter (Signed)
" °  Follow up Call-     05/07/2024    1:00 PM  Call back number  Post procedure Call Back phone  # [phone]  Permission to leave phone message Yes     Patient questions:  Do you have a fever, pain , or abdominal swelling? No. Pain Score  0 *  Have you tolerated food without any problems? Yes.    Have you been able to return to your normal activities? Yes.    Do you have any questions about your discharge instructions: Diet   No. Medications  No. Follow up visit  No.  Do you have questions or concerns about your Care? No.  Actions: * If pain score is 4 or above: No action needed, pain <4.   "

## 2024-05-09 ENCOUNTER — Ambulatory Visit: Admitting: Psychology

## 2024-05-09 DIAGNOSIS — F331 Major depressive disorder, recurrent, moderate: Secondary | ICD-10-CM

## 2024-05-09 NOTE — Progress Notes (Signed)
 "  [facility] Counselor/Therapist Progress Note  Patient ID: [name], MRN: [id number],    Date: 05/09/2024  Time Spent: 10:00am-10:45am   45 minutes   Treatment Type: Individual Therapy  Reported Symptoms: stress, worry  Mental Status Exam: Appearance:  Casual     Behavior: Appropriate  Motor: Normal  Speech/Language:  Normal Rate  Affect: Appropriate  Mood: normal  Thought process: normal  Thought content:   WNL  Sensory/Perceptual disturbances:   WNL  Orientation: oriented to person, place, time/date, and situation  Attention: Good  Concentration: Good  Memory: WNL  Fund of knowledge:  Good  Insight:   Good  Judgment:  Good  Impulse Control: Good   Risk Assessment: Danger to Self:  No Self-injurious Behavior: No Danger to Others: No Duty to Warn:no Physical Aggression / Violence:No  Access to Firearms a concern: No  Gang Involvement:No   Subjective: Pt present for face-to-face individual therapy in person.   Pt talked about the holidays.  They went better than she thought bc her brother did not talk about politics.   Pt talked about wanting to change psychiatric providers.  Helped pt with referral information.   Pt continues to feel down a lot and wants a new evaluation for medications.   Pt worries about retirement and having enough money.   She also does not know what she wants to do in retirement.   Pt has cut back work to 3 days a week.   She plans to continue that schedule for awhile to pay off some bills.   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  Addressed pt's worries.  Worked on thought reframing.  Worked with pt on releasing worry thoughts.  Pt talked about a friend that does not contact her as much bc he is married and busy with his wife now.   They are always on the go and pt feels like she is boring compared to them.  Pt tends to compare herself to others and then feel badly about herself.  Helped pt  process these issues.  Pt talked about her health.  She has been having tests bc she has been having abdominal pain.   Worked on self care strategies. Provided supportive therapy.    Interventions: Cognitive Behavioral Therapy and Insight-Oriented  Diagnosis:  F33.1   Plan of Care: Recommend ongoing therapy.   Pt participated in setting treatment goals.  Pt wants to have a safe place to talk and to improve coping skills and feel better about herself.  Plan to meet monthly.   Pt is in agreement with treatment plan.  Treatment Plan Client Abilities/Strengths  Pt is bright, engaging, and motivated for therapy.   Client Treatment Preferences  Individual therapy.  Client Statement of Needs  Improve coping skills.  Symptoms  Depressed or irritable mood. Diminished interest in or enjoyment of activities. Lack of energy. Feelings of hopelessness, worthlessness, or inappropriate guilt. Low self-esteem.  Problems Addressed  Unipolar Depression Goals 1. Alleviate depressive symptoms and return to previous level of effective functioning. 2. Appropriately grieve the loss in order to normalize mood and to return to previously adaptive level of functioning. Objective Learn and implement behavioral strategies to overcome depression. Target Date: 2025-01-03 Frequency: Monthly  Progress: 10 Modality: individual  Related Interventions Engage the client in behavioral activation, increasing his/her activity level and contact with sources of reward, while identifying processes that inhibit activation.  Use behavioral techniques such as instruction, rehearsal,  role-playing, role reversal, as needed, to facilitate activity in the client's daily life; reinforce success. Assist the client in developing skills that increase the likelihood of deriving pleasure from behavioral activation (e.g., assertiveness skills, developing an exercise plan, less internal/more external focus, increased social  involvement); reinforce success. Objective Identify important people in life, past and present, and describe the quality, good and poor, of those relationships. Target Date: 2025-01-03 Frequency: Monthly  Progress: 10 Modality: individual  Related Interventions Conduct Interpersonal Therapy beginning with the assessment of the client's interpersonal inventory of important past and present relationships; develop a case formulation linking depression to grief, interpersonal role disputes, role transitions, and/or interpersonal deficits). Objective Learn and implement problem-solving and decision-making skills. Target Date: 2025-01-03 Frequency: Monthly  Progress: 10 Modality: individual  Related Interventions Conduct Problem-Solving Therapy using techniques such as psychoeducation, modeling, and role-playing to teach client problem-solving skills (i.e., defining a problem specifically, generating possible solutions, evaluating the pros and cons of each solution, selecting and implementing a plan of action, evaluating the efficacy of the plan, accepting or revising the plan); role-play application of the problem-solving skill to a real life issue. Encourage in the client the development of a positive problem orientation in which problems and solving them are viewed as a natural part of life and not something to be feared, despaired, or avoided. 3. Develop healthy interpersonal relationships that lead to the alleviation and help prevent the relapse of depression. 4. Develop healthy thinking patterns and beliefs about self, others, and the world that lead to the alleviation and help prevent the relapse of depression. 5. Recognize, accept, and cope with feelings of depression. Diagnosis F33.1  Conditions For Discharge Achievement of treatment goals and objectives   Veva Alma, LCSW   "

## 2024-05-10 ENCOUNTER — Ambulatory Visit: Payer: Self-pay | Admitting: Pediatrics

## 2024-05-10 LAB — SURGICAL PATHOLOGY

## 2024-05-15 ENCOUNTER — Other Ambulatory Visit: Payer: Self-pay | Admitting: Family Medicine

## 2024-05-15 DIAGNOSIS — Z1231 Encounter for screening mammogram for malignant neoplasm of breast: Secondary | ICD-10-CM

## 2024-05-16 NOTE — Progress Notes (Signed)
 I sent a request to Dr. Marshell office.  We do not have a signed ROI for the patient on file, so they may not honor the request.

## 2024-05-22 ENCOUNTER — Ambulatory Visit
Admission: RE | Admit: 2024-05-22 | Discharge: 2024-05-22 | Disposition: A | Source: Ambulatory Visit | Attending: Family Medicine | Admitting: Family Medicine

## 2024-05-22 DIAGNOSIS — Z1231 Encounter for screening mammogram for malignant neoplasm of breast: Secondary | ICD-10-CM

## 2024-05-24 NOTE — Progress Notes (Signed)
 Faxed.

## 2024-05-31 NOTE — Progress Notes (Unsigned)
 Letter mailed to Dr. Marshell office to request medical records for continuity of care without patient's signature

## 2024-06-06 ENCOUNTER — Ambulatory Visit: Admitting: Psychology

## 2024-07-04 ENCOUNTER — Ambulatory Visit: Admitting: Psychology

## 2024-08-01 ENCOUNTER — Ambulatory Visit: Admitting: Psychology

## 2025-02-12 ENCOUNTER — Encounter: Payer: Self-pay | Admitting: Family Medicine
